# Patient Record
Sex: Male | Born: 1960 | Race: Black or African American | Hispanic: No | Marital: Single | Smoking: Current every day smoker
Health system: Southern US, Community
[De-identification: ages and names within clinical notes are randomized; demographics above are authoritative.]

## PROBLEM LIST (undated history)

## (undated) DIAGNOSIS — R51 Headache: Secondary | ICD-10-CM

## (undated) DIAGNOSIS — I10 Essential (primary) hypertension: Secondary | ICD-10-CM

## (undated) DIAGNOSIS — R9431 Abnormal electrocardiogram [ECG] [EKG]: Secondary | ICD-10-CM

## (undated) DIAGNOSIS — R079 Chest pain, unspecified: Secondary | ICD-10-CM

## (undated) DIAGNOSIS — Z9889 Other specified postprocedural states: Secondary | ICD-10-CM

## (undated) HISTORY — PX: IRRIGATION AND DEBRIDEMENT ABSCESS: SHX5252

---

## 2001-05-06 ENCOUNTER — Emergency Department (HOSPITAL_COMMUNITY): Admission: EM | Admit: 2001-05-06 | Discharge: 2001-05-06 | Payer: Self-pay | Admitting: Emergency Medicine

## 2001-05-11 ENCOUNTER — Ambulatory Visit (HOSPITAL_COMMUNITY): Admission: RE | Admit: 2001-05-11 | Discharge: 2001-05-11 | Payer: Self-pay | Admitting: General Surgery

## 2001-05-11 ENCOUNTER — Encounter: Payer: Self-pay | Admitting: General Surgery

## 2001-10-01 ENCOUNTER — Emergency Department (HOSPITAL_COMMUNITY): Admission: EM | Admit: 2001-10-01 | Discharge: 2001-10-01 | Payer: Self-pay | Admitting: Emergency Medicine

## 2001-10-28 ENCOUNTER — Emergency Department (HOSPITAL_COMMUNITY): Admission: EM | Admit: 2001-10-28 | Discharge: 2001-10-28 | Payer: Self-pay | Admitting: Emergency Medicine

## 2004-09-11 ENCOUNTER — Emergency Department (HOSPITAL_COMMUNITY): Admission: EM | Admit: 2004-09-11 | Discharge: 2004-09-11 | Payer: Self-pay | Admitting: Emergency Medicine

## 2004-10-30 ENCOUNTER — Emergency Department (HOSPITAL_COMMUNITY): Admission: EM | Admit: 2004-10-30 | Discharge: 2004-10-30 | Payer: Self-pay | Admitting: Emergency Medicine

## 2005-02-08 ENCOUNTER — Emergency Department (HOSPITAL_COMMUNITY): Admission: EM | Admit: 2005-02-08 | Discharge: 2005-02-08 | Payer: Self-pay | Admitting: Emergency Medicine

## 2005-06-09 ENCOUNTER — Emergency Department (HOSPITAL_COMMUNITY): Admission: EM | Admit: 2005-06-09 | Discharge: 2005-06-09 | Payer: Self-pay | Admitting: Emergency Medicine

## 2006-04-21 ENCOUNTER — Emergency Department (HOSPITAL_COMMUNITY): Admission: EM | Admit: 2006-04-21 | Discharge: 2006-04-21 | Payer: Self-pay | Admitting: Emergency Medicine

## 2006-09-10 ENCOUNTER — Emergency Department (HOSPITAL_COMMUNITY): Admission: EM | Admit: 2006-09-10 | Discharge: 2006-09-10 | Payer: Self-pay | Admitting: Emergency Medicine

## 2007-07-19 ENCOUNTER — Emergency Department (HOSPITAL_COMMUNITY): Admission: EM | Admit: 2007-07-19 | Discharge: 2007-07-19 | Payer: Self-pay | Admitting: Emergency Medicine

## 2007-09-14 ENCOUNTER — Emergency Department (HOSPITAL_COMMUNITY): Admission: EM | Admit: 2007-09-14 | Discharge: 2007-09-14 | Payer: Self-pay | Admitting: Emergency Medicine

## 2007-10-19 ENCOUNTER — Emergency Department (HOSPITAL_COMMUNITY): Admission: EM | Admit: 2007-10-19 | Discharge: 2007-10-19 | Payer: Self-pay | Admitting: Emergency Medicine

## 2008-06-09 ENCOUNTER — Emergency Department (HOSPITAL_COMMUNITY): Admission: EM | Admit: 2008-06-09 | Discharge: 2008-06-10 | Payer: Self-pay | Admitting: Emergency Medicine

## 2009-12-17 ENCOUNTER — Emergency Department (HOSPITAL_COMMUNITY): Admission: EM | Admit: 2009-12-17 | Discharge: 2009-12-17 | Payer: Self-pay | Admitting: Emergency Medicine

## 2010-10-18 ENCOUNTER — Encounter: Payer: Self-pay | Admitting: Internal Medicine

## 2011-07-02 LAB — RAPID STREP SCREEN (MED CTR MEBANE ONLY): Streptococcus, Group A Screen (Direct): NEGATIVE

## 2011-07-02 LAB — STREP A DNA PROBE: Group A Strep Probe: NEGATIVE

## 2011-07-02 LAB — URINALYSIS, ROUTINE W REFLEX MICROSCOPIC
Bilirubin Urine: NEGATIVE
Hgb urine dipstick: NEGATIVE
Nitrite: NEGATIVE
Protein, ur: NEGATIVE
pH: 6

## 2011-07-02 LAB — POCT CARDIAC MARKERS
Operator id: 211291
Troponin i, poc: <0.05

## 2011-07-02 LAB — POCT I-STAT CREATININE: Operator id: 211291

## 2011-07-02 LAB — RAPID URINE DRUG SCREEN, HOSP PERFORMED
Barbiturates: NOT DETECTED
Benzodiazepines: NOT DETECTED
Cocaine: POSITIVE — AB

## 2011-11-15 ENCOUNTER — Emergency Department (HOSPITAL_COMMUNITY)
Admission: EM | Admit: 2011-11-15 | Discharge: 2011-11-15 | Disposition: A | Discharge status: Home / Self Care | Payer: Self-pay | Attending: Emergency Medicine | Admitting: Emergency Medicine

## 2011-11-15 ENCOUNTER — Emergency Department (HOSPITAL_COMMUNITY): Payer: Self-pay

## 2011-11-15 ENCOUNTER — Encounter (HOSPITAL_COMMUNITY): Payer: Self-pay | Admitting: Emergency Medicine

## 2011-11-15 DIAGNOSIS — M25569 Pain in unspecified knee: Secondary | ICD-10-CM | POA: Insufficient documentation

## 2011-11-15 DIAGNOSIS — R269 Unspecified abnormalities of gait and mobility: Secondary | ICD-10-CM | POA: Insufficient documentation

## 2011-11-15 DIAGNOSIS — F17200 Nicotine dependence, unspecified, uncomplicated: Secondary | ICD-10-CM | POA: Insufficient documentation

## 2011-11-15 DIAGNOSIS — M25562 Pain in left knee: Secondary | ICD-10-CM

## 2011-11-15 MED ORDER — HYDROCODONE-ACETAMINOPHEN 5-325 MG PO TABS: ORAL_TABLET | ORAL | Status: AC

## 2011-11-15 MED ORDER — NAPROXEN 500 MG PO TABS: 500.0000 mg | ORAL_TABLET | Freq: Two times a day (BID) | ORAL | Status: DC

## 2011-11-15 NOTE — ED Provider Notes (Signed)
History     CSN: [id number]  Arrival date & time 11/15/11  4098   First MD Initiated Contact with Patient 11/15/11 [phone]      Chief Complaint  Patient presents with  . Knee Pain    (Consider location/radiation/quality/duration/timing/severity/associated sxs/prior treatment) HPI Comments: Patient complains of a gradual onset of left knee pain that began last evening. He denies known injury to the knee but states that he walked a lot yesterday.  He states the pain is worsened with movement, palpation, or weightbearing and improves with rest. Pain is localized to the medial aspect of the left knee.   He denies swelling, numbness, or weakness to the leg. He also denies any calf pain.  He states that he had similar symptoms back in 2002 and was given medication by his primary care physician at that time and the symptoms resolved.  Patient is a 51 y.o. male presenting with knee pain. The history is provided by the patient. No language interpreter was used.  Knee Pain This is a recurrent problem. The current episode started yesterday. The problem occurs constantly. The problem has been unchanged. Associated symptoms include arthralgias. Pertinent negatives include no chest pain, fever, headaches, joint swelling, nausea, neck pain, numbness, rash, urinary symptoms, vomiting or weakness. The symptoms are aggravated by walking, twisting and standing (Palpation). He has tried nothing for the symptoms. The treatment provided no relief.    History reviewed. No pertinent past medical history.  History reviewed. No pertinent past surgical history.  History reviewed. No pertinent family history.  History  Substance Use Topics  . Smoking status: Current Everyday Smoker    Types: Cigarettes  . Smokeless tobacco: Not on file  . Alcohol Use: Yes      Review of Systems  Constitutional: Negative for fever, activity change and appetite change.  HENT: Negative for neck pain.   Respiratory: Negative  for chest tightness.   Cardiovascular: Negative for chest pain.  Gastrointestinal: Negative for nausea and vomiting.  Musculoskeletal: Positive for arthralgias and gait problem. Negative for joint swelling.  Skin: Negative.  Negative for rash.  Neurological: Negative for dizziness, weakness, numbness and headaches.  All other systems reviewed and are negative.    Allergies  Review of patient's allergies indicates no known allergies.  Home Medications  No current outpatient prescriptions on file.  BP 119/78  Pulse 72  Temp(Src) 98.2 F (36.8 C) (Oral)  Resp 18  Ht 5\' 7"  (1.702 m)  Wt 175 lb (79.379 kg)  BMI 27.41 kg/m2  SpO2 99%  Physical Exam  Nursing note and vitals reviewed. Constitutional: He is oriented to person, place, and time. He appears well-developed and well-nourished. No distress.  HENT:  Head: Normocephalic and atraumatic.  Mouth/Throat: Oropharynx is clear and moist.  Neck: Normal range of motion. Neck supple.  Cardiovascular: Normal rate, regular rhythm, normal heart sounds and intact distal pulses.   Pulmonary/Chest: Effort normal and breath sounds normal. No respiratory distress.  Musculoskeletal: Normal range of motion. He exhibits tenderness. He exhibits no edema.       Left knee: He exhibits normal range of motion, no swelling, no effusion, no ecchymosis, no deformity, no laceration, no erythema, normal alignment and normal patellar mobility. tenderness found. Medial joint line tenderness noted. No lateral joint line and no patellar tendon tenderness noted.       Legs:      Localized tenderness to palpation along the medial meniscus.  Patient has full range of motion of the left knee,  no crepitus, effusion, erythema, ligament instabilty or obvious deformities of the knee.    Neurological: He is alert and oriented to person, place, and time. He exhibits normal muscle tone. Coordination normal.  Skin: Skin is warm and dry.    ED Course  Procedures  (including critical care time)   Dg Knee Complete 4 Views Left  11/15/2011  *RADIOLOGY REPORT*  Clinical Data: Left knee pain.  LEFT KNEE - COMPLETE 4+ VIEW  Comparison: None  Findings: The joint spaces are maintained.  No acute fracture or osteochondral abnormality.  No significant degenerative changes.  A small joint effusion is noted.  IMPRESSION:  1.  No acute bony findings or significant degenerative changes. 2.  Small joint effusion.  Original Report Authenticated By: P. Loralie Champagne, M.D.     Knee immobilizer was applied by the nursing staff.  Pain improved.  Remains NV intact.  Pt has his own crutches.    MDM     Patient's previous medical records were reviewed by me.  Previous imaging of the left knee in 2002 also reviewed by me.  Pt has ttp over the medial mensicus without edema, crepitus or decrease in ROM of the knee.  No posterior knee pain, calf pain or LE swelling.  Distal sensation intact, DP pulse is brisk.  Small effusion on x-ray.  We have applied a knee immobilizer and he agrees to close f/u with Dr. Romeo Apple.  Clinical suspicion for septic joint or DVT is low.   Patient / Family / Caregiver understand and agree with initial ED impression and plan with expectations set for ED visit. Pt feels improved after observation and/or treatment in ED.      Soriah Leeman L. [place], [place] 11/15/11 [phone]

## 2011-11-15 NOTE — ED Provider Notes (Signed)
Medical screening examination/treatment/procedure(s) were performed by non-physician practitioner and as supervising physician I was immediately available for consultation/collaboration.  Nicholes Stairs, MD 11/15/11 [phone]

## 2011-11-15 NOTE — ED Notes (Signed)
Patient with no complaints at this time. Respirations even and unlabored. Skin warm/dry. Discharge instructions reviewed with patient at this time. Patient given opportunity to voice concerns/ask questions. Patient discharged at this time and left Emergency Department with steady gait.   

## 2011-11-15 NOTE — Discharge Instructions (Signed)
Knee Pain °Knee pain can be a result of an injury or other medical conditions. Treatment will depend on the cause of your pain. °HOME CARE °· Only take medicine as told by your doctor.  °· Keep a healthy weight. Being overweight can make the knee hurt more.  °· Stretch before exercising or playing sports.  °· If there is constant knee pain, change the way you exercise. Ask your doctor for advice.  °· Make sure shoes fit well. Choose the right shoe for the sport or activity.  °· Protect your knees. Wear kneepads if needed.  °· Rest when you are tired.  °GET HELP RIGHT AWAY IF:  °· Your knee pain does not stop.  °· Your knee pain does not get better.  °· Your knee joint feels hot to the touch.  °· You have a temperature by mouth above 102° F (38.9° C), not controlled by medicine.  °· Your baby is older than 3 months with a rectal temperature of 102° F (38.9° C) or higher.  °· Your baby is 3 months old or younger with a rectal temperature of 100.4° F (38° C) or higher.  °MAKE SURE YOU:  °· Understand these instructions.  °· Will watch this condition.  °· Will get help right away if you are not doing well or get worse.  °Document Released: 12/10/2008 Document Revised: 05/26/2011 Document Reviewed: 12/10/2008 °[employer]® Patient Information ©2012 [employer]. °

## 2011-11-15 NOTE — ED Notes (Signed)
Pt c/o left knee pain that started last night. Pt denies any injury to the area.

## 2012-05-08 ENCOUNTER — Emergency Department (HOSPITAL_COMMUNITY): Payer: No Typology Code available for payment source

## 2012-05-08 ENCOUNTER — Encounter (HOSPITAL_COMMUNITY): Payer: Self-pay | Admitting: *Deleted

## 2012-05-08 ENCOUNTER — Emergency Department (HOSPITAL_COMMUNITY)
Admission: EM | Admit: 2012-05-08 | Discharge: 2012-05-08 | Disposition: A | Discharge status: Home / Self Care | Payer: No Typology Code available for payment source | Attending: Emergency Medicine | Admitting: Emergency Medicine

## 2012-05-08 DIAGNOSIS — M549 Dorsalgia, unspecified: Secondary | ICD-10-CM

## 2012-05-08 DIAGNOSIS — S139XXA Sprain of joints and ligaments of unspecified parts of neck, initial encounter: Secondary | ICD-10-CM | POA: Insufficient documentation

## 2012-05-08 DIAGNOSIS — Y92410 Unspecified street and highway as the place of occurrence of the external cause: Secondary | ICD-10-CM | POA: Insufficient documentation

## 2012-05-08 DIAGNOSIS — S161XXA Strain of muscle, fascia and tendon at neck level, initial encounter: Secondary | ICD-10-CM

## 2012-05-08 DIAGNOSIS — F17200 Nicotine dependence, unspecified, uncomplicated: Secondary | ICD-10-CM | POA: Insufficient documentation

## 2012-05-08 MED ORDER — OXYCODONE-ACETAMINOPHEN 5-325 MG PO TABS: 1.0000 | ORAL_TABLET | ORAL | Status: AC | PRN

## 2012-05-08 MED ORDER — NAPROXEN 500 MG PO TABS: 500.0000 mg | ORAL_TABLET | Freq: Two times a day (BID) | ORAL | Status: DC
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
## 2012-05-13 NOTE — ED Provider Notes (Signed)
Medical screening examination/treatment/procedure(s) were performed by non-physician practitioner and as supervising physician I was immediately available for consultation/collaboration.   Lilo Wallington L Daylah Sayavong, MD 05/13/12 1504 

## 2013-02-23 ENCOUNTER — Encounter (HOSPITAL_COMMUNITY): Payer: Self-pay

## 2013-02-23 ENCOUNTER — Emergency Department (INDEPENDENT_AMBULATORY_CARE_PROVIDER_SITE_OTHER)
Admission: EM | Admit: 2013-02-23 | Discharge: 2013-02-23 | Disposition: A | Discharge status: Home / Self Care | Payer: Self-pay | Source: Home / Self Care

## 2013-02-23 DIAGNOSIS — M545 Low back pain: Secondary | ICD-10-CM

## 2013-02-23 DIAGNOSIS — A630 Anogenital (venereal) warts: Secondary | ICD-10-CM

## 2013-02-23 LAB — POCT URINALYSIS DIP (DEVICE)
Bilirubin Urine: NEGATIVE
Hgb urine dipstick: NEGATIVE
Ketones, ur: NEGATIVE mg/dL
Specific Gravity, Urine: >=1.03 (ref 1.005–1.030)
pH: 5.5 (ref 5.0–8.0)

## 2013-02-23 MED ORDER — NAPROXEN 500 MG PO TABS: 500.0000 mg | ORAL_TABLET | Freq: Two times a day (BID) | ORAL | Status: DC

## 2013-02-23 MED ORDER — IMIQUIMOD 5 % EX CREA
TOPICAL_CREAM | CUTANEOUS | Status: DC
Start: 2013-02-23 — End: 2013-02-27

## 2013-02-23 NOTE — ED Provider Notes (Signed)
Medical screening examination/treatment/procedure(s) were performed by non-physician practitioner and as supervising physician I was immediately available for consultation/collaboration.  Tony Granquist, M.D.  Jaman Aro C Kelse Ploch, MD 02/23/13 [phone] 

## 2013-02-23 NOTE — ED Notes (Signed)
Presents w 2 problems. 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 C/o has pain in his low back for several days, and has a lesion of some kind on side of penis

## 2013-02-23 NOTE — ED Provider Notes (Signed)
History     CSN: [id number]  Arrival date & time 02/23/13  1328   None     Chief Complaint  Patient presents with  . Back Pain    (Consider location/radiation/quality/duration/timing/severity/associated sxs/prior treatment) HPI Comments: Pt presents c/o lesions on his penis and lower back pain.  He first noticed these a few days ago.  He picked one of the lesions off and it bled, but it has not been painful at all.  He does not think he has ever had these before.  His lower back pain has been going on for about a month and is worst after he has been immobile for some time, relieved by walking around and loosening it up.  Denies groin pain, pain in testicles or penis, fever, chills, abdominal pain, dysuria.    Patient is a 52 y.o. male presenting with back pain.  Back Pain Associated symptoms: no abdominal pain, no chest pain, no dysuria, no fever and no weakness     History reviewed. No pertinent past medical history.  History reviewed. No pertinent past surgical history.  History reviewed. No pertinent family history.  History  Substance Use Topics  . Smoking status: Current Every Day Smoker    Types: Cigarettes  . Smokeless tobacco: Not on file  . Alcohol Use: Yes      Review of Systems  Constitutional: Negative for fever, chills and fatigue.  HENT: Negative for sore throat, neck pain and neck stiffness.   Eyes: Negative for visual disturbance.  Respiratory: Negative for cough and shortness of breath.   Cardiovascular: Negative for chest pain, palpitations and leg swelling.  Gastrointestinal: Negative for nausea, vomiting, abdominal pain, diarrhea and constipation.  Genitourinary: Negative for dysuria, urgency, frequency and hematuria.       Penile lesions   Musculoskeletal: Positive for back pain. Negative for myalgias and arthralgias.  Skin: Negative for rash.  Neurological: Negative for dizziness, weakness and light-headedness.    Allergies  Review of  patient's allergies indicates no known allergies.  Home Medications   Current Outpatient Rx  Name  Route  Sig  Dispense  Refill  . imiquimod (ALDARA) 5 % cream   Topical   Apply topically 3 (three) times a week. Apply MWF for up to 16 weeks as needed   12 each   0   . naproxen (NAPROSYN) 500 MG tablet   Oral   Take 1 tablet (500 mg total) by mouth 2 (two) times daily.   30 tablet   0   . naproxen (NAPROSYN) 500 MG tablet   Oral   Take 1 tablet (500 mg total) by mouth 2 (two) times daily.   60 tablet   0     BP 125/77  Pulse 70  Temp(Src) 98.5 F (36.9 C) (Oral)  Resp 20  SpO2 98%  Physical Exam  Constitutional: He is oriented to person, place, and time. He appears well-developed and well-nourished. No distress.  HENT:  Head: Normocephalic and atraumatic.  Eyes: EOM are normal. Pupils are equal, round, and reactive to light.  Cardiovascular: Normal rate and regular rhythm.  Exam reveals no gallop and no friction rub.   No murmur heard. Pulmonary/Chest: Effort normal and breath sounds normal. No respiratory distress. He has no wheezes. He has no rales.  Abdominal: Soft. There is no tenderness.  Genitourinary:    Circumcised. No penile erythema or penile tenderness. No discharge found.  Musculoskeletal:       Lumbar back: He exhibits tenderness (mild  midline and in paraspinous musculature ). He exhibits normal range of motion.  Neurological: He is oriented to person, place, and time.  Skin: Skin is warm and dry. No rash noted.  Psychiatric: He has a normal mood and affect. Judgment normal.    ED Course  Procedures (including critical care time)  Labs Reviewed  POCT URINALYSIS DIP (DEVICE)   No results found.   1. Condyloma acuminatum of penis   2. Lumbago       MDM  Will treat this patients low back pain with naproxen BID and encouraged to stay active, also given a list of back strengthening exercises to do.    For the condyloma, Aldara three times  weekly for up to 16 weeks.  He will f/u with PCP or derm if this does not resolve the issue.    Meds ordered this encounter  Medications  . imiquimod (ALDARA) 5 % cream    Sig: Apply topically 3 (three) times a week. Apply MWF for up to 16 weeks as needed    Dispense:  12 each    Refill:  0  . naproxen (NAPROSYN) 500 MG tablet    Sig: Take 1 tablet (500 mg total) by mouth 2 (two) times daily.    Dispense:  60 tablet    Refill:  0           Graylon Good, PA-C 02/23/13 [phone]

## 2013-02-23 NOTE — ED Notes (Signed)
Waiting discharge papers 

## 2013-02-26 DIAGNOSIS — R079 Chest pain, unspecified: Secondary | ICD-10-CM

## 2013-02-26 HISTORY — DX: Chest pain, unspecified: R07.9

## 2013-02-27 ENCOUNTER — Inpatient Hospital Stay (HOSPITAL_COMMUNITY)
Admission: EM | Admit: 2013-02-27 | Discharge: 2013-03-01 | DRG: 287 | Disposition: A | Discharge status: Home / Self Care | Payer: 59 | Attending: Internal Medicine | Admitting: Internal Medicine

## 2013-02-27 ENCOUNTER — Encounter (HOSPITAL_COMMUNITY): Payer: Self-pay | Admitting: Emergency Medicine

## 2013-02-27 ENCOUNTER — Emergency Department (HOSPITAL_COMMUNITY): Payer: Self-pay

## 2013-02-27 DIAGNOSIS — Z7982 Long term (current) use of aspirin: Secondary | ICD-10-CM

## 2013-02-27 DIAGNOSIS — R0789 Other chest pain: Secondary | ICD-10-CM | POA: Diagnosis present

## 2013-02-27 DIAGNOSIS — R079 Chest pain, unspecified: Secondary | ICD-10-CM

## 2013-02-27 DIAGNOSIS — R072 Precordial pain: Secondary | ICD-10-CM

## 2013-02-27 DIAGNOSIS — R9431 Abnormal electrocardiogram [ECG] [EKG]: Secondary | ICD-10-CM | POA: Diagnosis present

## 2013-02-27 DIAGNOSIS — F1410 Cocaine abuse, uncomplicated: Secondary | ICD-10-CM | POA: Diagnosis present

## 2013-02-27 DIAGNOSIS — R059 Cough, unspecified: Secondary | ICD-10-CM | POA: Diagnosis present

## 2013-02-27 DIAGNOSIS — I10 Essential (primary) hypertension: Principal | ICD-10-CM | POA: Diagnosis present

## 2013-02-27 DIAGNOSIS — F1210 Cannabis abuse, uncomplicated: Secondary | ICD-10-CM | POA: Diagnosis present

## 2013-02-27 DIAGNOSIS — Z720 Tobacco use: Secondary | ICD-10-CM | POA: Diagnosis present

## 2013-02-27 DIAGNOSIS — R51 Headache: Secondary | ICD-10-CM | POA: Diagnosis present

## 2013-02-27 DIAGNOSIS — J020 Streptococcal pharyngitis: Secondary | ICD-10-CM | POA: Diagnosis present

## 2013-02-27 DIAGNOSIS — Z9889 Other specified postprocedural states: Secondary | ICD-10-CM

## 2013-02-27 DIAGNOSIS — F17200 Nicotine dependence, unspecified, uncomplicated: Secondary | ICD-10-CM | POA: Diagnosis present

## 2013-02-27 DIAGNOSIS — R05 Cough: Secondary | ICD-10-CM | POA: Diagnosis present

## 2013-02-27 DIAGNOSIS — I200 Unstable angina: Secondary | ICD-10-CM

## 2013-02-27 DIAGNOSIS — F1411 Cocaine abuse, in remission: Secondary | ICD-10-CM | POA: Diagnosis not present

## 2013-02-27 HISTORY — DX: Headache: R51

## 2013-02-27 HISTORY — DX: Chest pain, unspecified: R07.9

## 2013-02-27 HISTORY — DX: Abnormal electrocardiogram (ECG) (EKG): R94.31

## 2013-02-27 HISTORY — DX: Other specified postprocedural states: Z98.890

## 2013-02-27 LAB — URINALYSIS, ROUTINE W REFLEX MICROSCOPIC
Leukocytes, UA: NEGATIVE
Nitrite: NEGATIVE
Specific Gravity, Urine: 1.02 (ref 1.005–1.030)
Urobilinogen, UA: 0.2 mg/dL (ref 0.0–1.0)
pH: 5.5 (ref 5.0–8.0)

## 2013-02-27 LAB — DIFFERENTIAL
Basophils Relative: 0 % (ref 0–1)
Lymphocytes Relative: 16 % (ref 12–46)
Lymphs Abs: 2 10*3/uL (ref 0.7–4.0)
Monocytes Absolute: 2.2 10*3/uL — ABNORMAL HIGH (ref 0.1–1.0)
Monocytes Relative: 18 % — ABNORMAL HIGH (ref 3–12)
Neutro Abs: 8.2 10*3/uL — ABNORMAL HIGH (ref 1.7–7.7)

## 2013-02-27 LAB — BASIC METABOLIC PANEL
BUN: 9 mg/dL (ref 6–23)
CO2: 25 mEq/L (ref 19–32)
Calcium: 9.4 mg/dL (ref 8.4–10.5)
GFR calc non Af Amer: >90 mL/min (ref >90)
Glucose, Bld: 109 mg/dL — ABNORMAL HIGH (ref 70–99)

## 2013-02-27 LAB — HEMOGLOBIN A1C: Mean Plasma Glucose: 111 mg/dL (ref <117)

## 2013-02-27 LAB — RAPID URINE DRUG SCREEN, HOSP PERFORMED
Barbiturates: NOT DETECTED
Benzodiazepines: NOT DETECTED
Cocaine: NOT DETECTED

## 2013-02-27 LAB — HEPATIC FUNCTION PANEL
ALT: 17 U/L (ref 0–53)
AST: 17 U/L (ref 0–37)
Alkaline Phosphatase: 89 U/L (ref 39–117)
Bilirubin, Direct: <0.1 mg/dL (ref 0.0–0.3)

## 2013-02-27 LAB — TROPONIN I
Troponin I: <0.3 ng/mL (ref <0.30)
Troponin I: <0.3 ng/mL (ref <0.30)

## 2013-02-27 LAB — CBC
HCT: 42.5 % (ref 39.0–52.0)
Hemoglobin: 15.3 g/dL (ref 13.0–17.0)
MCH: 29.3 pg (ref 26.0–34.0)
MCHC: 36 g/dL (ref 30.0–36.0)
MCV: 81.3 fL (ref 78.0–100.0)
RBC: 5.23 MIL/uL (ref 4.22–5.81)

## 2013-02-27 LAB — LIPID PANEL
Cholesterol: 189 mg/dL (ref 0–200)
Total CHOL/HDL Ratio: 3.3 RATIO
Triglycerides: 39 mg/dL (ref <150)
VLDL: 8 mg/dL (ref 0–40)

## 2013-02-27 MED ORDER — HEPARIN BOLUS VIA INFUSION
4000.0000 [IU] | Freq: Once | INTRAVENOUS | Status: AC
  Administered 2013-02-27: 4000 [IU] via INTRAVENOUS
  Filled 2013-02-27: qty 4000

## 2013-02-27 MED ORDER — ACETAMINOPHEN 325 MG PO TABS
650.0000 mg | ORAL_TABLET | Freq: Once | ORAL | Status: AC
  Administered 2013-02-27: 650 mg via ORAL
  Filled 2013-02-27: qty 2

## 2013-02-27 MED ORDER — ASPIRIN 325 MG PO TABS
325.0000 mg | ORAL_TABLET | ORAL | Status: AC
  Administered 2013-02-27: 325 mg via ORAL
  Filled 2013-02-27: qty 4

## 2013-02-27 MED ORDER — ACETAMINOPHEN 325 MG PO TABS
650.0000 mg | ORAL_TABLET | Freq: Four times a day (QID) | ORAL | Status: DC | PRN
  Administered 2013-02-27 – 2013-02-28 (×3): 650 mg via ORAL
  Filled 2013-02-27 (×3): qty 2

## 2013-02-27 MED ORDER — MORPHINE SULFATE 2 MG/ML IJ SOLN
2.0000 mg | Freq: Once | INTRAMUSCULAR | Status: AC
  Administered 2013-02-27: 2 mg via INTRAVENOUS
  Filled 2013-02-27: qty 1

## 2013-02-27 MED ORDER — SODIUM CHLORIDE 0.9 % IJ SOLN
3.0000 mL | Freq: Two times a day (BID) | INTRAMUSCULAR | Status: DC
  Administered 2013-02-27 – 2013-03-01 (×5): 3 mL via INTRAVENOUS

## 2013-02-27 MED ORDER — DILTIAZEM HCL 30 MG PO TABS
30.0000 mg | ORAL_TABLET | Freq: Four times a day (QID) | ORAL | Status: DC
  Administered 2013-02-27 – 2013-02-28 (×4): 30 mg via ORAL
  Filled 2013-02-27 (×8): qty 1

## 2013-02-27 MED ORDER — MORPHINE SULFATE 2 MG/ML IJ SOLN
2.0000 mg | INTRAMUSCULAR | Status: DC | PRN
  Administered 2013-02-27: 2 mg via INTRAVENOUS
  Filled 2013-02-27: qty 1

## 2013-02-27 MED ORDER — ONDANSETRON HCL 4 MG PO TABS: 4.0000 mg | ORAL_TABLET | Freq: Four times a day (QID) | ORAL | Status: DC | PRN

## 2013-02-27 MED ORDER — MENTHOL 3 MG MT LOZG
1.0000 | LOZENGE | OROMUCOSAL | Status: DC | PRN
  Filled 2013-02-27: qty 9

## 2013-02-27 MED ORDER — ACETAMINOPHEN 650 MG RE SUPP: 650.0000 mg | Freq: Four times a day (QID) | RECTAL | Status: DC | PRN

## 2013-02-27 MED ORDER — ENOXAPARIN SODIUM 40 MG/0.4ML ~~LOC~~ SOLN: 40.0000 mg | SUBCUTANEOUS | Status: DC

## 2013-02-27 MED ORDER — ATORVASTATIN CALCIUM 80 MG PO TABS
80.0000 mg | ORAL_TABLET | Freq: Every day | ORAL | Status: DC
  Administered 2013-02-27 – 2013-03-01 (×3): 80 mg via ORAL
  Filled 2013-02-27 (×4): qty 1

## 2013-02-27 MED ORDER — THIAMINE HCL 100 MG/ML IJ SOLN
Freq: Once | INTRAVENOUS | Status: AC
  Administered 2013-02-27: 12:00:00 via INTRAVENOUS
  Filled 2013-02-27: qty 1000

## 2013-02-27 MED ORDER — ONDANSETRON HCL 4 MG/2ML IJ SOLN: 4.0000 mg | Freq: Four times a day (QID) | INTRAMUSCULAR | Status: DC | PRN

## 2013-02-27 MED ORDER — NITROGLYCERIN 0.4 MG SL SUBL: 0.4000 mg | SUBLINGUAL_TABLET | SUBLINGUAL | Status: DC | PRN

## 2013-02-27 MED ORDER — HEPARIN BOLUS VIA INFUSION
3000.0000 [IU] | Freq: Once | INTRAVENOUS | Status: AC
  Administered 2013-02-27: 3000 [IU] via INTRAVENOUS
  Filled 2013-02-27: qty 3000

## 2013-02-27 MED ORDER — ASPIRIN EC 81 MG PO TBEC
81.0000 mg | DELAYED_RELEASE_TABLET | Freq: Every day | ORAL | Status: DC
  Filled 2013-02-27 (×2): qty 1

## 2013-02-27 MED ORDER — NITROGLYCERIN 0.4 MG SL SUBL
0.4000 mg | SUBLINGUAL_TABLET | SUBLINGUAL | Status: DC | PRN
  Administered 2013-02-27 (×3): 0.4 mg via SUBLINGUAL
  Filled 2013-02-27: qty 25

## 2013-02-27 MED ORDER — HEPARIN (PORCINE) IN NACL 100-0.45 UNIT/ML-% IJ SOLN
1550.0000 [IU]/h | INTRAMUSCULAR | Status: DC
  Administered 2013-02-27: 1400 [IU]/h via INTRAVENOUS
  Administered 2013-02-27: 1000 [IU]/h via INTRAVENOUS
  Administered 2013-02-28: 1400 [IU]/h via INTRAVENOUS
  Administered 2013-02-28: 1550 [IU]/h via INTRAVENOUS
  Filled 2013-02-27 (×4): qty 250

## 2013-02-27 NOTE — ED Provider Notes (Signed)
Medical screening examination/treatment/procedure(s) were performed by non-physician practitioner and as supervising physician I was immediately available for consultation/collaboration.   Charles B. Bernette Mayers, MD 02/27/13 0830

## 2013-02-27 NOTE — Consult Note (Signed)
Pt. Seen and examined. Agree with the NP/PA-C note as written.  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 He doesn't report significant cardiac disease in the family, but there is diabetes. He has had a couple of episodes last night and this morning of squeezing substernal chest pain, headache, nausea and lack of appetite. He also felt throat tightness, which virtually disappeared along with his chest pain after the administration of nitroglycerin. Initial troponin is negative, however, it may be too early to see a rise. EKG shows LVH and T-wave changes which could represent ischemia or repolarization abnormality. Unfortunately, with cocaine use and tobacco use, he is at risk for coronary plaque or coronary spasm.  Cardiac catheterization in this setting is the most reasonable way to evaluate his coronaries.  I discussed the cardiac catheterization procedure with him at length, including the risks/benefits and he is agreeable to it.  Plan to keep him NPO p MN for [facility] tomorrow.  Continue heparin - banana bag. Nitroglycerin prn for chest pain - he is chest pain free now.  We can continue to follow with you or would be happy to assume care on our service - please let us know.  Chrystie Nose, MD, [facility] Attending Cardiologist [facility]

## 2013-02-27 NOTE — H&P (Signed)
Date: 02/27/2013               Patient Name:  Ivan Murphy MRN: [id number]  DOB: 09-Jul-1961 Age / Sex: 52 y.o., male   PCP: None              Medical Service: Internal Medicine Teaching Service              Attending Physician: Dr. Lars Mage, MD    First Contact: Dr. Christen Bame Pager: [phone]  Second Contact: Dr. Lorretta Harp Pager: [phone]            After Hours (After 5p/  First Contact Pager: [phone]  weekends / holidays): Second Contact Pager: [phone]   Chief Complaint: Chest pain, cough, sore throat  History of Present Illness: Ivan Murphy is a 52 year old man with no known PMH who presents to the [facility] ED this morning after being awakened from sleep about midnight with left sided chest pain.  He states that he has never had pain like this before and that it is a squeezing pain that starts in his left chest and moves to his left axilla.  He rates the pain 7/10 and it has not responded to tylenol or 3 nitroglycerin.  He denies radiation to the neck, down the arm, or to the back.  He also denies fever, chills, nausea, vomiting, rhinorrhea, reflux, or abdominal pain.  He states that yesterday he started to get a sore throat and a non-productive cough.  He also developed a headache yesterday afternoon which did not respond to a [employer].  He states that he has no family history of heart disease, high blood pressure, or cholesterol problems.  He has 1 brother who had diabetes who passed away in Feb 13, 1979 but he doesn't know what he died from.  Father is deceased but he does not know the cause.    Meds: No current outpatient prescriptions on file.   Allergies: Allergies as of 02/27/2013  . (No Known Allergies)   History reviewed. No pertinent past medical history. History reviewed. No pertinent past surgical history. Family History  Problem Relation Age of Onset  . Diabetes Brother    History   Social History  . Marital Status: Divorced    Spouse Name: N/A    Number of  Children: N/A  . Years of Education: N/A   Occupational History  . Not on file.   Social History Main Topics  . Smoking status: Former Smoker -- 1.00 packs/day for 39 years    Types: Cigarettes    Quit date: 02/12/2013  . Smokeless tobacco: Never Used  . Alcohol Use: No     Comment: history of cocaine and marijuana.  clean since 02/12/13.    . Drug Use: No  . Sexually Active: Not Currently   Other Topics Concern  . Not on file   Social History Narrative   Divorced.  One daughter 32 years old.  2 grandchildren.  Father deceased and mother living in group home in [place].  Currently living at the [facility] house.  Former cocaine, marijuana, and tobacco user clean since 02/12/13 when he moved into the [facility].  Works with the [employer].   Review of Systems: Constitutional: Denies fever, chills, diaphoresis, appetite change and fatigue.  HEENT: Denies photophobia, eye pain, redness, hearing loss, ear pain, congestion, sore throat, rhinorrhea, sneezing, mouth sores, trouble swallowing, neck pain, neck stiffness and tinnitus.   Respiratory: Positive for SOB, cough.  Denies  DOE, chest tightness,  and wheezing.   Cardiovascular: Positive for  chest pain.  Denies palpitations and leg swelling.  Gastrointestinal: Denies nausea, vomiting, abdominal pain, diarrhea, constipation, blood in stool and abdominal distention.  Genitourinary: Denies dysuria, urgency, frequency, hematuria, flank pain and difficulty urinating.  Endocrine: Denies: hot or cold intolerance, sweats, changes in hair or nails, polyuria, polydipsia. Musculoskeletal: Denies myalgias, back pain, joint swelling, arthralgias and gait problem.  Skin: Denies pallor, rash and wound.  Neurological: Denies dizziness, seizures, syncope, weakness, light-headedness, numbness and headaches.  Hematological: Denies adenopathy. Easy bruising, personal or family bleeding history  Psychiatric/Behavioral: Denies suicidal ideation, mood  changes, confusion, nervousness, sleep disturbance and agitation  Physical Exam: Blood pressure 123/81, pulse 89, temperature 100.7 F (38.2 C), temperature source Oral, resp. rate 15, SpO2 98.00%. Constitutional: Vital signs reviewed.  Patient is a well-developed and well-nourished man in no acute distress and cooperative with exam. Alert and oriented x3.  Head: Normocephalic and atraumatic Ear: TM normal bilaterally Nose: No erythema or drainage noted.  Turbinates normal Mouth: no erythema or exudates, MMM Eyes: PERRL, EOMI, conjunctivae normal, No scleral icterus.  Neck: Supple, Trachea midline normal ROM, No JVD, mass, thyromegaly, or carotid bruit present.  Cardiovascular: RRR, S1 normal, S2 normal, no MRG, pulses symmetric and intact bilaterally Pulmonary/Chest: normal respiratory effort, mild bibasilar inspiratory crackles that clear with cough.  no wheezes, or rhonchi Abdominal: Soft. Non-tender, non-distended, bowel sounds are normal, no masses, organomegaly, or guarding present.  GU: no CVA tenderness Musculoskeletal: No joint deformities, erythema, or stiffness, ROM full and no nontender Hematology: no cervical, inginal, or axillary adenopathy.  Neurological: A&O x3, Strength is normal and symmetric bilaterally, cranial nerve II-XII are grossly intact, no focal motor deficit, sensory intact to light touch bilaterally.  Skin: Warm, dry and intact. No rash, cyanosis, or clubbing.  Psychiatric: Normal mood and affect. speech and behavior is normal. Judgment and thought content normal. Cognition and memory are normal.   Lab results: Basic Metabolic Panel:  Recent Labs  16/10/96 0641  NA 136  K 3.8  CL 100  CO2 25  GLUCOSE 109*  BUN 9  CREATININE 0.98  CALCIUM 9.4   CBC:  Recent Labs  02/27/13 0641  WBC 11.0*  HGB 15.3  HCT 42.5  MCV 81.3  PLT 186   Cardiac Enzymes: POC Troponin I: 0.00  BNP:  Recent Labs  02/27/13 0640  PROBNP 61.2   Drugs of Abuse      Component Value Date/Time   LABOPIA NONE DETECTED 09/14/2007 0923   COCAINSCRNUR POSITIVE* 09/14/2007 0923   LABBENZ NONE DETECTED 09/14/2007 0923   AMPHETMU NONE DETECTED 09/14/2007 0923   THCU NONE DETECTED 09/14/2007 0923   LABBARB  Value: NONE DETECTED        DRUG SCREEN FOR MEDICAL PURPOSES ONLY.  IF CONFIRMATION IS NEEDED FOR ANY PURPOSE, NOTIFY LAB WITHIN 5 DAYS. 09/14/2007 0454    Imaging results:  Dg Chest 2 View  02/27/2013   *RADIOLOGY REPORT*  Clinical Data: Chest pain.  CHEST - 2 VIEW  Comparison: 09/15/2007  Findings: The heart size and pulmonary vascularity are normal and the lungs are clear.  No acute osseous abnormality.  Slight thoracic scoliosis.  IMPRESSION: No acute disease.  Of the   Original Report Authenticated By: Francene Boyers, M.D.   Other results: EKG: normal sinus rhythm, T wave inversions in II, III, aVF, V4, V5, and V6.  Borderline LVH, Likely left atrial enlargement.   Assessment & Plan by Problem:  Mr. Wrightson is a 52 year old man who presents to the [facility] ED with complaints of chest pain that started the morning of admission that awakened him from sleep.    1.  Chest pain: He is a former smoker as well as former cocaine user who has been clean for 4 weeks.  He has no history of hypertension, diabetes, or hypercholesterolemia.  He also has no family history of heart disease per his report. Differential diagnosis include ACS vs PE vs pneumonia vs muscle strain from coughing.  Initial Troponin is negative but his EKG is positive for T wave inversions in II, III, aVF, V4, V5, and V6 as well as likely Left atrial enlargement and LVH.  He is a former heavy smoker as well as a previous cocaine user though he states that he has been clean for 4 weeks from both.  His Modified Geneva score is 3, only for his pulse in the 80s, which is low probability.  Chest x-ray is clear of infiltrates but he does have a mild fever, cough, and a mildly elevated WBC count.  He has received 3  nitroglycerin in the ED which did not relieve his pain.  The pain did respond to 2 mg of morphine.    - Admit to observation telemetry   - Cycle troponin and repeat EKG in the morning  - Heparin drip per pharmacy until enzymes negative.   - ASA 81 mg  - risk stratification with FLP, A1C, and TSH  - Given his T wave changes and atypical presentation he will likely need a stress test done so we will consult cardiology.   - UDS  - 1L Banana bag  2.  Cough: He has a mild non-productive cough and sore throat though no signs of acute infection in the posterior pharynx.  Chest x-ray is clear and he does not appear ill.  We will continue to monitor and treat symptomatically.  3.  VTE: Lovenox  Dispo: Disposition is deferred at this time, awaiting improvement of current medical problems. Anticipated discharge in approximately 1-2 day(s).   The patient does not have a current PCP (No Pcp Per Patient), therefore will be requiring OPC follow-up after discharge.   The patient does not have transportation limitations that hinder transportation to clinic appointments.  Signed: Leodis Sias, MD 02/27/2013, 9:14 AM

## 2013-02-27 NOTE — ED Notes (Signed)
PT. REPORTS INTERMITTENT MID CHEST PAIN WITH SOB AND OCCASIONAL PRODUCTIVE COUGH ONSET THIS MORNING , DENIES NAUSEA OR DIAPHORESIS , PT. ALSO REPORTS SORE THROAT.

## 2013-02-27 NOTE — Progress Notes (Signed)
ANTICOAGULATION CONSULT NOTE - Initial Consult  Pharmacy Consult for IV heparin Indication: r/o ACS - STEMI  No Known Allergies  Patient Measurements: Height: 5\' 7"  (170.2 cm) Weight: 184 lb 3.2 oz (83.553 kg) IBW/kg (Calculated) : 66.1 Heparin Dosing Weight: 83 kg   Vital Signs: Temp: 100.7 F (38.2 C) (06/03 1029) Temp src: Oral (06/03 0723) BP: 118/67 mmHg (06/03 1029) Pulse Rate: 84 (06/03 1029)   Recent Labs  02/27/13 0641  HGB 15.3  HCT 42.5  PLT 186  CREATININE 0.98    Estimated Creatinine Clearance: 92.2 ml/min (by C-G formula based on Cr of 0.98).   Medical History: History reviewed. No pertinent past medical history.   Assessment: 52yo male presenting with chest pain, diaphoresis, nausea, and T-wave inversions being started on IV heparin for rule out ACS/STEMI. Hgb 15.3, Plts 186, Scr 0.98   Goal of Therapy:  Heparin level 0.3-0.7 units/ml Monitor platelets by anticoagulation protocol: Yes    Plan:  1) Heparin IV 4000 unit bolus x 1 2) Followed by IV heparin 1000 units/hr 3) Check 6 hour heparin level, then daily HL & CBC while on heparin 4) Monitor signs/symptoms of bleeding  5) F/u cardiac markers/plans   Benjaman Pott, PharmD, BCPS 02/27/2013   10:56 AM

## 2013-02-27 NOTE — Consult Note (Signed)
Reason for Consult: Chest pain Referring Physician:   [name] is an 52 y.o. male.  HPI:    The patient is a 52 year old African American male with history tobacco abuse since age 22 and  cocaine use. He apparently has not had any in the last month. He has no prior cardiac history and no family history of heart disease that he knows of.  Patient reports development of chest pain last night between 12 midnight and a 0100 hours after he got up to go the bathroom.  He indicated the pain did not wake him from sleep. He states that pain was 7-8/10 in intensity and felt like"Something squeezing me".  He also reports nausea and diaphoresis but no radiation of pain to arm, neck, back or jaw. He states says his shirt was so wet he had to change it.  He also reports that the pain was exacerbated with movement and inspiration, but that it also was improved with the second sublingual nitroglycerin which was given in the ER.  He is currently pain-free.  History reviewed. No pertinent past medical history.  History reviewed. No pertinent past surgical history.  Family History  Problem Relation Age of Onset  . Diabetes Brother     Social History:  reports that he quit smoking about 2 weeks ago. His smoking use included Cigarettes. He has a 39 pack-year smoking history. He has never used smokeless tobacco. He reports that he does not drink alcohol or use illicit drugs.  Allergies: No Known Allergies  Medications: None  Results for orders placed during the hospital encounter of 02/27/13 (from the past 48 hour(s))  PRO B NATRIURETIC PEPTIDE     Status: None   Collection Time    02/27/13  6:40 AM      Result Value Range   Pro B Natriuretic peptide (BNP) 61.2  0 - 125 pg/mL  CBC     Status: Abnormal   Collection Time    02/27/13  6:41 AM      Result Value Range   WBC 11.0 (*) 4.0 - 10.5 K/uL   RBC 5.23  4.22 - 5.81 MIL/uL   Hemoglobin 15.3  13.0 - 17.0 g/dL   HCT 96.0  45.4 - 09.8 %   MCV 81.3   78.0 - 100.0 fL   MCH 29.3  26.0 - 34.0 pg   MCHC 36.0  30.0 - 36.0 g/dL   RDW 11.9  14.7 - 82.9 %   Platelets 186  150 - 400 K/uL  BASIC METABOLIC PANEL     Status: Abnormal   Collection Time    02/27/13  6:41 AM      Result Value Range   Sodium 136  135 - 145 mEq/L   Potassium 3.8  3.5 - 5.1 mEq/L   Chloride 100  96 - 112 mEq/L   CO2 25  19 - 32 mEq/L   Glucose, Bld 109 (*) 70 - 99 mg/dL   BUN 9  6 - 23 mg/dL   Creatinine, Ser 5.62  0.50 - 1.35 mg/dL   Calcium 9.4  8.4 - 13.0 mg/dL   GFR calc non Af Amer >90  >90 mL/min   GFR calc Af Amer >90  >90 mL/min   Comment:            The eGFR has been calculated     using the CKD EPI equation.     This calculation has not been     validated in  all clinical     situations.     eGFR's persistently     <90 mL/min signify     possible Chronic Kidney Disease.  POCT I-STAT TROPONIN I     Status: None   Collection Time    02/27/13  7:00 AM      Result Value Range   Troponin i, poc 0.00  0.00 - 0.08 ng/mL   Comment 3            Comment: Due to the release kinetics of cTnI,     a negative result within the first hours     of the onset of symptoms does not rule out     myocardial infarction with certainty.     If myocardial infarction is still suspected,     repeat the test at appropriate intervals.    Dg Chest 2 View  02/27/2013   *RADIOLOGY REPORT*  Clinical Data: Chest pain.  CHEST - 2 VIEW  Comparison: 09/15/2007  Findings: The heart size and pulmonary vascularity are normal and the lungs are clear.  No acute osseous abnormality.  Slight thoracic scoliosis.  IMPRESSION: No acute disease.  Of the   Original Report Authenticated By: Francene Boyers, M.D.    Review of Systems  Constitutional: Positive for diaphoresis. Negative for fever.  HENT: Positive for sore throat.   Respiratory: Negative for cough and shortness of breath.   Cardiovascular: Positive for chest pain. Negative for orthopnea, leg swelling and PND.  Gastrointestinal:  Positive for nausea. Negative for vomiting, abdominal pain, blood in stool and melena.  Genitourinary: Negative for hematuria.  Musculoskeletal: Positive for back pain (started about2 weeks ago).  Neurological: Negative for dizziness.  All other systems reviewed and are negative.   Blood pressure 114/78, pulse 81, temperature 100.7 F (38.2 C), temperature source Oral, resp. rate 15, SpO2 98.00%. Physical Exam Well nourished, well developed, in no acute distress HEENT: Pupils are equal round react to light accommodation extraocular movements are intact. Oral mucosa is pink and moist. Neck: no JVDNo cervical lymphadenopathy. Cardiac: Regular rate and rhythm without murmurs rubs or gallops. Lungs:  clear to auscultation bilaterally, no wheezing, rhonchi or rales Abd: soft, nontender, positive bowel sounds all quadrants, no hepatosplenomegaly Ext: no lower extremity edema.  2+ radial and dorsalis pedis pulses. Skin: warm and dry Neuro:  Grossly normal, strength 5 out of 5 equal upper and lower extremities  EKG: Shows sharp T-wave inversions inferiorly and in V 4-6, LVH  Assessment/Plan: Principal Problem:   Chest pain Active Problems:   Headache   Cough   Plan: 52 year old male with history of cocaine use, tobacco abuse since age 18. He presented with left-sided "squeezing" chest pain, diaphoresis, nausea and with T wave inversions inferior and laterally along with LVH indications(no prior EKG). He also reported to me that he had decreased chest pain after the second sublingual nitroglycerin.  Initial troponin negative. The patient will probably be best evaluated with coronary angiogram.  Avoid use of beta blockers given cocaine history.  Continue to cycle cardiac enzymes. Check lipids, TSH, A1c.  Will order 2D echo.  Blood pressure heart rate currently controlled.  UDS is negative.  No signs or symptoms of CHF.   Ivan Murphy 02/27/2013, 10:12 AM

## 2013-02-27 NOTE — ED Notes (Signed)
Admitting, MD notified re: pts pain level & current bed assignment, will continue to monitor pt

## 2013-02-27 NOTE — Progress Notes (Signed)
  Echocardiogram 2D Echocardiogram has been performed.  Georgian Co 02/27/2013, 5:42 PM

## 2013-02-27 NOTE — H&P (Signed)
Internal Medicine Attending Admission Note Date: 02/27/2013  Patient name: Ivan Murphy Medical record number: [id number] Date of birth: 1960/12/29 Age: 52 y.o. Gender: male  I saw and evaluated the patient. I reviewed the resident's note and I agree with the resident's findings and plan as documented in the resident's note.  Chief Complaint(s):  Chest pain  History - key components related to admission: patient is a 52 year old man with limited medical care in the past and medical history significant for smoking and substance abuse including cocaine comes in with chief complaint of chest pain which started midnight when he woke up to use the bathroom.  Pain 8/10, left side of chest, squeezing/pressure like in character, associated with nausea and diaphoresis. Pain did not radiate. Exacerbated by movement and inspiration. Patient could not sleep all night due to pain and decided to come to ER in the morning. Patient's pain was relieved after morphine and nitroglycerin in ER. He is currently chest pain free. Patient was having headaches all day yesterday ie one day prior to admission for which he took 2 goody powders during the day and another 2 prior to getting to sleep.   Patient works as a Engineer, water and his physical activity has been limited by chest pain since last few week. He can only walk about 1 block prior to getting short of breath.   He has not smoked or used any drugs in last 3 weeks.   Patient denies any shortness of breath, belly pain, change in urinary habits, change in bowel movements, sleeping problems, weakness or numbness anywhere in the body.   15 point review of system is otherwise negative except what is noted above.    Physical Exam - key components related to admission:  Filed Vitals:   02/27/13 0845 02/27/13 1000 02/27/13 1015 02/27/13 1029  BP: 123/81 114/78 122/68 118/67  Pulse: 89 81 76 84  Temp:    100.7 F (38.2 C)  TempSrc:      Resp: 15 15 20 18   Height:     5\' 7"  (1.702 m)  Weight:    184 lb 3.2 oz (83.553 kg)  SpO2: 98% 98% 97% 100%   Physical Exam: General: Vital signs reviewed and noted. Well-developed, well-nourished, in no acute distress; alert, appropriate and cooperative throughout examination.  Head: Normocephalic, atraumatic.  Eyes: PERRL, EOMI, No signs of anemia or jaundince.  Nose: Mucous membranes moist, not inflammed, nonerythematous.  Throat: Oropharynx nonerythematous, no exudate appreciated.   Neck: No deformities, masses, or tenderness noted.Supple, No carotid Bruits, no JVD.  Lungs:  Normal respiratory effort. Clear to auscultation BL without crackles or wheezes.  Heart: RRR. S1 and S2 normal without gallop, murmur, or rubs.  Abdomen:  BS normoactive. Soft, Nondistended, non-tender.  No masses or organomegaly.  Extremities: No pretibial edema.  Neurologic: A&O X3, CN II - XII are grossly intact. Motor strength is 5/5 in the all 4 extremities, Sensations intact to light touch, Cerebellar signs negative.  Skin: No visible rashes, scars.    Lab results:   Basic Metabolic Panel:  Recent Labs  40/98/11 0641  NA 136  K 3.8  CL 100  CO2 25  GLUCOSE 109*  BUN 9  CREATININE 0.98  CALCIUM 9.4   CBC:  Recent Labs  02/27/13 0641  WBC 11.0*  HGB 15.3  HCT 42.5  MCV 81.3  PLT 186   Urine Drug Screen: Negative for cocaine  Alcohol Level: No results found for this basename: ETH,  in the last 72 hours Urinalysis: Color, Urine YELLOW YELLOW Final APPearance CLEAR CLEAR Final Specific Gravity, Urine 1.020 1.005 - 1.030 Final pH 5.5 5.0 - 8.0 Final Glucose, UA 100 (A) NEGATIVE mg/dL Final Hgb urine dipstick NEGATIVE NEGATIVE Final Bilirubin Urine NEGATIVE NEGATIVE Final Ketones, ur NEGATIVE NEGATIVE mg/dL Final Protein, ur NEGATIVE NEGATIVE mg/dL Final Urobilinogen, UA 0.2 0.0 - 1.0 mg/dL Final Nitrite NEGATIVE NEGATIVE Final Leukocytes, UA NEGATIVE NEGATIVE Final MICROSCOPIC NOT DONE ON URINES WITH NEGATIVE PROTEIN,  BLOOD, LEUKOCYTES, NITRITE, OR GLUCOSE <1000    Imaging results:  Dg Chest 2 View  02/27/2013   *RADIOLOGY REPORT*  Clinical Data: Chest pain.  CHEST - 2 VIEW  Comparison: 09/15/2007  Findings: The heart size and pulmonary vascularity are normal and the lungs are clear.  No acute osseous abnormality.  Slight thoracic scoliosis.  IMPRESSION: No acute disease.  Of the   Original Report Authenticated By: Francene Boyers, M.D.    Other results: EKG: 84 bpm, nsr, normal axis, normal intervals, TWI in inferior leads and some repolarization abnormalities in anterior precordial leads could be from LVH  Assessment & Plan by Problem:  Principal Problem:   Chest pain Active Problems:   Headache   Cough   Tobacco abuse   Hx of cocaine abuse  Patient is 52 year old man with symptoms consistent with typical chest pain for ACS. He does have risk factors which include smoking, cocaine use and not on any medications for lipid control(limited medical care). Patient's symptoms are concerning for unstable angina given his risk factors and EKG changes.  -Admit to telemetry -high dose statins -aspirin -heparin drip -avoid betablockers due to cocaine use history -calcium channel blockers -nitro, oxygen and morphine as needed -morning ekg -cardiology consult -Hba1c, lipid panel, tsh for risk stratification.  Rest of the medical management as per resident note.

## 2013-02-27 NOTE — Progress Notes (Signed)
ANTICOAGULATION CONSULT NOTE - Initial Consult  Pharmacy Consult for IV heparin Indication: r/o ACS - STEMI  No Known Allergies  Patient Measurements: Height: 5\' 7"  (170.2 cm) Weight: 184 lb 3.2 oz (83.553 kg) IBW/kg (Calculated) : 66.1 Heparin Dosing Weight: 83 kg   Vital Signs: Temp: 101.1 F (38.4 C) (06/03 1814) BP: 114/66 mmHg (06/03 1420) Pulse Rate: 81 (06/03 1420)   Recent Labs  02/27/13 0641 02/27/13 1300 02/27/13 1618 02/27/13 1809  HGB 15.3  --   --   --   HCT 42.5  --   --   --   PLT 186  --   --   --   HEPARINUNFRC  --   --   --  <0.10*  CREATININE 0.98  --   --   --   TROPONINI  --  <0.30 <0.30  --     Estimated Creatinine Clearance: 92.2 ml/min (by C-G formula based on Cr of 0.98).   Medical History: Past Medical History  Diagnosis Date  . Chest pain at rest 02/26/2013  . [id number](540.9)      Assessment: 52yo male presenting with chest pain, diaphoresis, nausea, and T-wave inversions being started on IV heparin for rule out ACS/STEMI. Hgb 15.3, Plts 186, Scr 0.98.  First heparin level <0.1   Goal of Therapy:  Heparin level 0.3-0.7 units/ml Monitor platelets by anticoagulation protocol: Yes    Plan:  1) Heparin IV 3000 unit bolus x 1 2) Increase IV heparin to 1400 units/hr 3) Check 6 hour heparin level, then daily HL & CBC while on heparin 4) Monitor signs/symptoms of bleeding  5) F/u cardiac markers/plans   Celedonio Miyamoto, PharmD, BCPS Clinical Pharmacist Pager [phone]   02/27/2013   7:33 PM

## 2013-02-27 NOTE — Progress Notes (Signed)
INTERNAL MEDICINE TEACHING SERVICE [facility] Progress Note   Subjective:    We evaluated the patient for fevers. He reports that he has sore throat for the last three days. No history of sick contact. No photophobia, neck pain for back pain. It hurts when he swallows. No history of confusion. He denies cough, nasal congestion or increased mucus production. He however, reports headache.   He reports that his chest pain has resolved for now.    Objective:    BP 114/66  Pulse 81  Temp(Src) 101.1 F (38.4 C) (Oral)  Resp 18  Ht 5\' 7"  (1.702 m)  Wt 184 lb 3.2 oz (83.553 kg)  BMI 28.84 kg/m2  SpO2 99%   Labs: Basic Metabolic Panel:    Component Value Date/Time   NA 136 02/27/2013 0641   K 3.8 02/27/2013 0641   CL 100 02/27/2013 0641   CO2 25 02/27/2013 0641   BUN 9 02/27/2013 0641   CREATININE 0.98 02/27/2013 0641   GLUCOSE 109* 02/27/2013 0641   CALCIUM 9.4 02/27/2013 0641    CBC:    Component Value Date/Time   WBC 11.0* 02/27/2013 0641   HGB 15.3 02/27/2013 0641   HCT 42.5 02/27/2013 0641   PLT 186 02/27/2013 0641   MCV 81.3 02/27/2013 0641    Cardiac Enzymes: Lab Results  Component Value Date   TROPONINI <0.30 02/27/2013    Physical Exam: General:     Vital signs reviewed and noted. Well-developed, well-nourished, in no acute distress; alert, appropriate and cooperative throughout examination. Bilateral anterior cervical small and tender lymphadenopathy.  No oropharyngeal exudates.   Neurologic:  A&O x 3. Neck is supple. PEARL bilaterally, Kernig's sign - negative. DTR normal.   Lungs:  Normal respiratory effort. Clear to auscultation BL without crackles or wheezes.  Heart: RRR. S1 and S2 normal without gallop, murmur, or rubs.  Abdomen:  BS normoactive. Soft, Nondistended, non-tender.  No masses or organomegaly.  Extremities: No pretibial edema.     Assessment/ Plan:    Acute pharyngitis: Centor score - 2 points. Probability of Strep Pharyngitis: 11% - 17%. Meningitis  unlikely.  Plan - Throat Culture after the rapid strep test  - will consider antibiotics only for positive culture results. - symptomatic management for now with Cepacol lozenges   Signed:  Dow Adolph, MD PGY-1 Internal Medicine Teaching Service Pager: [phone]  02/27/2013, 8:51 PM

## 2013-02-27 NOTE — ED Provider Notes (Signed)
History     CSN: [id number]  Arrival date & time 02/27/13  1027   First MD Initiated Contact with Patient 02/27/13 [phone]      Chief Complaint  Patient presents with  . Chest Pain    (Consider location/radiation/quality/duration/timing/severity/associated sxs/prior treatment) HPI Comments: Patient presents emergency department with chief complaint of chest pain times several hours. He states that he first noticed the chest pain at around 2 AM while lying in bed. He endorses some mild associated shortness of breath and diaphoresis. He states that currently, he is no longer having any chest pain. He has a 25-pack-year history of smoking, but does not have any other risk factors. Additionally, he is complaining of throat pain x2-3 days. Denies fevers, chills, nausea, or vomiting.  The history is provided by the patient. No language interpreter was used.    History reviewed. No pertinent past medical history.  History reviewed. No pertinent past surgical history.  No family history on file.  History  Substance Use Topics  . Smoking status: Current Every Day Smoker    Types: Cigarettes  . Smokeless tobacco: Not on file  . Alcohol Use: Yes      Review of Systems  All other systems reviewed and are negative.    Allergies  Review of patient's allergies indicates no known allergies.  Home Medications   Current Outpatient Rx  Name  Route  Sig  Dispense  Refill  . imiquimod (ALDARA) 5 % cream   Topical   Apply topically 3 (three) times a week. Apply MWF for up to 16 weeks as needed   12 each   0   . naproxen (NAPROSYN) 500 MG tablet   Oral   Take 1 tablet (500 mg total) by mouth 2 (two) times daily.   30 tablet   0   . naproxen (NAPROSYN) 500 MG tablet   Oral   Take 1 tablet (500 mg total) by mouth 2 (two) times daily.   60 tablet   0     There were no vitals taken for this visit.  Physical Exam  Nursing note and vitals reviewed. Constitutional: He is  oriented to person, place, and time. He appears well-developed and well-nourished.  HENT:  Head: Normocephalic and atraumatic.  Eyes: Conjunctivae and EOM are normal. Pupils are equal, round, and reactive to light. Right eye exhibits no discharge. Left eye exhibits no discharge. No scleral icterus.  Neck: Normal range of motion. Neck supple. No JVD present.  Cardiovascular: Normal rate, regular rhythm, normal heart sounds and intact distal pulses.  Exam reveals no gallop and no friction rub.   No murmur heard. Pulmonary/Chest: Effort normal and breath sounds normal. No respiratory distress. He has no wheezes. He has no rales. He exhibits no tenderness.  Abdominal: Soft. Bowel sounds are normal. He exhibits no distension and no mass. There is no tenderness. There is no rebound and no guarding.  Musculoskeletal: Normal range of motion. He exhibits no edema and no tenderness.  Neurological: He is alert and oriented to person, place, and time. He has normal reflexes.  CN 3-12 intact  Skin: Skin is warm and dry.  Psychiatric: He has a normal mood and affect. His behavior is normal. Judgment and thought content normal.    ED Course  Procedures (including critical care time)  Labs Reviewed  CBC  BASIC METABOLIC PANEL  PRO B NATRIURETIC PEPTIDE   Results for orders placed during the hospital encounter of 02/27/13  CBC  Result Value Range   WBC 11.0 (*) 4.0 - 10.5 K/uL   RBC 5.23  4.22 - 5.81 MIL/uL   Hemoglobin 15.3  13.0 - 17.0 g/dL   HCT 16.1  09.6 - 04.5 %   MCV 81.3  78.0 - 100.0 fL   MCH 29.3  26.0 - 34.0 pg   MCHC 36.0  30.0 - 36.0 g/dL   RDW 40.9  81.1 - 91.4 %   Platelets 186  150 - 400 K/uL  BASIC METABOLIC PANEL      Result Value Range   Sodium 136  135 - 145 mEq/L   Potassium 3.8  3.5 - 5.1 mEq/L   Chloride 100  96 - 112 mEq/L   CO2 25  19 - 32 mEq/L   Glucose, Bld 109 (*) 70 - 99 mg/dL   BUN 9  6 - 23 mg/dL   Creatinine, Ser 7.82  0.50 - 1.35 mg/dL   Calcium 9.4   8.4 - 95.6 mg/dL   GFR calc non Af Amer >90  >90 mL/min   GFR calc Af Amer >90  >90 mL/min  PRO B NATRIURETIC PEPTIDE      Result Value Range   Pro B Natriuretic peptide (BNP) 61.2  0 - 125 pg/mL  POCT I-STAT TROPONIN I      Result Value Range   Troponin i, poc 0.00  0.00 - 0.08 ng/mL   Comment 3            Dg Chest 2 View  02/27/2013   *RADIOLOGY REPORT*  Clinical Data: Chest pain.  CHEST - 2 VIEW  Comparison: 09/15/2007  Findings: The heart size and pulmonary vascularity are normal and the lungs are clear.  No acute osseous abnormality.  Slight thoracic scoliosis.  IMPRESSION: No acute disease.  Of the   Original Report Authenticated By: Francene Boyers, M.D.     ED ECG REPORT  I personally interpreted this EKG   Date: 02/27/2013   Rate: 84  Rhythm: normal sinus rhythm  QRS Axis: normal  Intervals: normal  ST/T Wave abnormalities: Diffuse T-wave inversion  Conduction Disutrbances:none  Narrative Interpretation:   Old EKG Reviewed: none available    1. Chest pain       MDM  Patient with chest pain times several hours.   Will check basic labs.  8:16 AM Discussed with Dr. Bernette Mayers, who recommends admit for ACS rule out based on history and EKG findings.  Discussed with internal medicine residents, who will admit the patient.        Roxy Horseman, PA-C 02/27/13 [phone]

## 2013-02-28 ENCOUNTER — Encounter (HOSPITAL_COMMUNITY): Payer: Self-pay | Admitting: Cardiology

## 2013-02-28 ENCOUNTER — Encounter (HOSPITAL_COMMUNITY)
Admission: EM | Disposition: A | Discharge status: Home / Self Care | Payer: Self-pay | Source: Home / Self Care | Attending: Internal Medicine

## 2013-02-28 DIAGNOSIS — R9431 Abnormal electrocardiogram [ECG] [EKG]: Secondary | ICD-10-CM

## 2013-02-28 DIAGNOSIS — Z9889 Other specified postprocedural states: Secondary | ICD-10-CM

## 2013-02-28 DIAGNOSIS — J020 Streptococcal pharyngitis: Secondary | ICD-10-CM | POA: Diagnosis present

## 2013-02-28 DIAGNOSIS — R079 Chest pain, unspecified: Secondary | ICD-10-CM

## 2013-02-28 HISTORY — PX: LEFT HEART CATHETERIZATION WITH CORONARY ANGIOGRAM: SHX5451

## 2013-02-28 HISTORY — PX: CARDIAC CATHETERIZATION: SHX172

## 2013-02-28 HISTORY — DX: Other specified postprocedural states: Z98.890

## 2013-02-28 HISTORY — DX: Abnormal electrocardiogram (ECG) (EKG): R94.31

## 2013-02-28 LAB — CBC
HCT: 39.9 % (ref 39.0–52.0)
Hemoglobin: 14 g/dL (ref 13.0–17.0)
MCV: 80.9 fL (ref 78.0–100.0)
RBC: 4.93 MIL/uL (ref 4.22–5.81)
WBC: 14 10*3/uL — ABNORMAL HIGH (ref 4.0–10.5)

## 2013-02-28 LAB — PROTIME-INR
INR: 1.05 (ref 0.00–1.49)
Prothrombin Time: 13.6 seconds (ref 11.6–15.2)

## 2013-02-28 LAB — HEPARIN LEVEL (UNFRACTIONATED): Heparin Unfractionated: 0.31 IU/mL (ref 0.30–0.70)

## 2013-02-28 SURGERY — LEFT HEART CATHETERIZATION WITH CORONARY ANGIOGRAM
Anesthesia: LOCAL

## 2013-02-28 MED ORDER — ACETAMINOPHEN 325 MG PO TABS
650.0000 mg | ORAL_TABLET | ORAL | Status: DC | PRN
  Administered 2013-02-28: 650 mg via ORAL
  Filled 2013-02-28: qty 2

## 2013-02-28 MED ORDER — MIDAZOLAM HCL 2 MG/2ML IJ SOLN
INTRAMUSCULAR | Status: AC
  Filled 2013-02-28: qty 2

## 2013-02-28 MED ORDER — ATORVASTATIN CALCIUM 10 MG PO TABS: 10.0000 mg | ORAL_TABLET | Freq: Every day | ORAL | Status: DC

## 2013-02-28 MED ORDER — ASPIRIN 81 MG PO CHEW
324.0000 mg | CHEWABLE_TABLET | ORAL | Status: AC
  Administered 2013-02-28: 324 mg via ORAL
  Filled 2013-02-28: qty 4

## 2013-02-28 MED ORDER — SODIUM CHLORIDE 0.9 % IJ SOLN
3.0000 mL | Freq: Two times a day (BID) | INTRAMUSCULAR | Status: DC
  Administered 2013-02-28: 3 mL via INTRAVENOUS

## 2013-02-28 MED ORDER — AMOXICILLIN 500 MG PO CAPS
500.0000 mg | ORAL_CAPSULE | Freq: Two times a day (BID) | ORAL | Status: DC
  Administered 2013-02-28 – 2013-03-01 (×4): 500 mg via ORAL
  Filled 2013-02-28 (×5): qty 1

## 2013-02-28 MED ORDER — HEPARIN SODIUM (PORCINE) 1000 UNIT/ML IJ SOLN
INTRAMUSCULAR | Status: AC
  Filled 2013-02-28: qty 1

## 2013-02-28 MED ORDER — SODIUM CHLORIDE 0.9 % IV SOLN
INTRAVENOUS | Status: AC
  Administered 2013-02-28: 17:00:00 via INTRAVENOUS

## 2013-02-28 MED ORDER — SODIUM CHLORIDE 0.9 % IV SOLN
INTRAVENOUS | Status: DC
  Administered 2013-02-28: 10:00:00 via INTRAVENOUS

## 2013-02-28 MED ORDER — ONDANSETRON HCL 4 MG/2ML IJ SOLN: 4.0000 mg | Freq: Four times a day (QID) | INTRAMUSCULAR | Status: DC | PRN

## 2013-02-28 MED ORDER — DILTIAZEM HCL ER COATED BEADS 120 MG PO CP24: 120.0000 mg | ORAL_CAPSULE | Freq: Every day | ORAL | Status: DC

## 2013-02-28 MED ORDER — SODIUM CHLORIDE 0.9 % IV SOLN: 250.0000 mL | INTRAVENOUS | Status: DC | PRN

## 2013-02-28 MED ORDER — MORPHINE SULFATE 2 MG/ML IJ SOLN: 1.0000 mg | INTRAMUSCULAR | Status: DC | PRN

## 2013-02-28 MED ORDER — HEPARIN BOLUS VIA INFUSION
1200.0000 [IU] | Freq: Once | INTRAVENOUS | Status: AC
  Administered 2013-02-28: 1200 [IU] via INTRAVENOUS
  Filled 2013-02-28: qty 1200

## 2013-02-28 MED ORDER — VERAPAMIL HCL 2.5 MG/ML IV SOLN
INTRAVENOUS | Status: AC
  Filled 2013-02-28: qty 2

## 2013-02-28 MED ORDER — FENTANYL CITRATE 0.05 MG/ML IJ SOLN
INTRAMUSCULAR | Status: AC
  Filled 2013-02-28: qty 2

## 2013-02-28 MED ORDER — LIDOCAINE HCL (PF) 1 % IJ SOLN
INTRAMUSCULAR | Status: AC
  Filled 2013-02-28: qty 30

## 2013-02-28 MED ORDER — HEPARIN (PORCINE) IN NACL 2-0.9 UNIT/ML-% IJ SOLN
INTRAMUSCULAR | Status: AC
  Filled 2013-02-28: qty 1000

## 2013-02-28 MED ORDER — ASPIRIN 81 MG PO CHEW
81.0000 mg | CHEWABLE_TABLET | Freq: Every day | ORAL | Status: DC
  Administered 2013-03-01: 81 mg via ORAL
  Filled 2013-02-28: qty 1

## 2013-02-28 MED ORDER — SODIUM CHLORIDE 0.9 % IJ SOLN: 3.0000 mL | INTRAMUSCULAR | Status: DC | PRN

## 2013-02-28 MED ORDER — AMOXICILLIN 500 MG PO CAPS: 500.0000 mg | ORAL_CAPSULE | Freq: Two times a day (BID) | ORAL | Status: AC

## 2013-02-28 NOTE — Progress Notes (Signed)
Pt lives at halfway house, needs SW to arrange return. Will d/c tomorrow.

## 2013-02-28 NOTE — H&P (Signed)
    Pt was reexamined and existing H & P reviewed. No changes found.  Runell Gess, MD [facility] 02/28/2013 1:44 PM

## 2013-02-28 NOTE — Progress Notes (Signed)
ANTICOAGULATION CONSULT NOTE  Pharmacy Consult for IV heparin Indication: r/o ACS - STEMI  No Known Allergies  Patient Measurements: Height: 5\' 7"  (170.2 cm) Weight: 179 lb (81.194 kg) IBW/kg (Calculated) : 66.1 Heparin Dosing Weight: 81 kg   Vital Signs: Temp: 100 F (37.8 C) (06/04 0458) Temp src: Oral (06/04 0458) BP: 135/63 mmHg (06/04 0458) Pulse Rate: 78 (06/04 0458)   Recent Labs  02/27/13 0641 02/27/13 1300 02/27/13 1618 02/27/13 1809 02/27/13 2150 02/28/13 0140 02/28/13 0935  HGB 15.3  --   --   --   --  14.0  --   HCT 42.5  --   --   --   --  39.9  --   PLT 186  --   --   --   --  188  --   LABPROT  --   --   --   --   --   --  13.6  INR  --   --   --   --   --   --  1.05  HEPARINUNFRC  --   --   --  <0.10*  --  0.31 0.26*  CREATININE 0.98  --   --   --   --   --   --   TROPONINI  --  <0.30 <0.30  --  <0.30  --   --     Estimated Creatinine Clearance: 90.9 ml/min (by C-G formula based on Cr of 0.98).  Assessment: 52 yo male continuing on IV heparin - plans for cath later today. Repeat heparin level a little below goal at 0.26 at rate of 1400 units/hr, Hgb 14.0, Plts 188, with no issues reported by RN and not bleeding issues noted in chart.   Goal of Therapy:  Heparin level 0.3-0.7 units/ml Monitor platelets by anticoagulation protocol: Yes   Plan:  1) IV Heparin bolus 1200 units x 1 2) Increase IV Heparin to 1550 units/hr 3) Check 6 hour heparin level, then daily heparin level & CBC (if continue post-cath) 4) F/u plans post-cath    Benjaman Pott, PharmD, BCPS 02/28/2013   11:28 AM

## 2013-02-28 NOTE — Progress Notes (Signed)
[facility]  Subjective: No further CP.  Objective: Vital signs in last 24 hours: Temp:  [98.9 F (37.2 C)-101.5 F (38.6 C)] 100 F (37.8 C) (06/04 0458) Pulse Rate:  [76-92] 78 (06/04 0458) Resp:  [15-20] 18 (06/03 1420) BP: (114-135)/(63-78) 135/63 mmHg (06/04 0458) SpO2:  [93 %-100 %] 93 % (06/04 0458) Weight:  [179 lb (81.194 kg)-184 lb 3.2 oz (83.553 kg)] 179 lb (81.194 kg) (06/04 0458) Last BM Date: 02/26/13  Intake/Output from previous day: 06/03 0701 - 06/04 0700 In: 220 [P.O.:220] Out: 2415 [Urine:2415] Intake/Output this shift: Total I/O In: -  Out: 300 [Urine:300]  Medications Current Facility-Administered Medications  Medication Dose Route Frequency Provider Last Rate Last Dose  . acetaminophen (TYLENOL) tablet 650 mg  650 mg Oral Q6H PRN Leodis Sias, MD   650 mg at 02/28/13 6295   Or  . acetaminophen (TYLENOL) suppository 650 mg  650 mg Rectal Q6H PRN Leodis Sias, MD      . amoxicillin (AMOXIL) capsule 500 mg  500 mg Oral Q12H Belia Heman, MD   500 mg at 02/28/13 0212  . aspirin EC tablet 81 mg  81 mg Oral Daily Leodis Sias, MD      . atorvastatin (LIPITOR) tablet 80 mg  80 mg Oral q1800 Lars Mage, MD   80 mg at 02/27/13 1506  . diltiazem (CARDIZEM) tablet 30 mg  30 mg Oral Q6H Ankit Garg, MD   30 mg at 02/28/13 0647  . heparin ADULT infusion 100 units/mL (25000 units/250 mL)  1,400 Units/hr Intravenous Continuous Leodis Sias, MD 14 mL/hr at 02/28/13 0213 1,400 Units/hr at 02/28/13 0213  . menthol-cetylpyridinium (CEPACOL) lozenge 3 mg  1 lozenge Oral PRN Dow Adolph, MD      . morphine 2 MG/ML injection 2-4 mg  2-4 mg Intravenous Q4H PRN Leodis Sias, MD   2 mg at 02/27/13 1814  . nitroGLYCERIN (NITROSTAT) SL tablet 0.4 mg  0.4 mg Sublingual Q5 min PRN Charles B. Bernette Mayers, MD   0.4 mg at 02/27/13 0827  . nitroGLYCERIN (NITROSTAT) SL tablet 0.4 mg  0.4 mg Sublingual Q5 min PRN Lars Mage, MD      . ondansetron (ZOFRAN) tablet 4 mg  4 mg Oral Q6H PRN Leodis Sias, MD       Or  . ondansetron [facility]) injection 4 mg  4 mg Intravenous Q6H PRN Leodis Sias, MD      . sodium chloride 0.9 % injection 3 mL  3 mL Intravenous Q12H Leodis Sias, MD   3 mL at 02/28/13 0000    PE: General appearance: alert, cooperative and no distress Lungs: clear to auscultation bilaterally Heart: regular rate and rhythm, S1, S2 normal, no murmur, click, rub or gallop Extremities: No LEE Pulses: 2+ and symmetric Neurologic: Grossly normal  Lab Results:   Recent Labs  02/27/13 0641 02/28/13 0140  WBC 11.0* 14.0*  HGB 15.3 14.0  HCT 42.5 39.9  PLT 186 188   BMET  Recent Labs  02/27/13 0641  NA 136  K 3.8  CL 100  CO2 25  GLUCOSE 109*  BUN 9  CREATININE 0.98  CALCIUM 9.4   PT/INR No results found for this basename: LABPROT, INR,  in the last 72 hours Cholesterol  Recent Labs  02/27/13 1254  CHOL 189   Lipid Panel     Component Value Date/Time   CHOL 189 02/27/2013 1254   TRIG 39 02/27/2013 1254   HDL 57 02/27/2013  1254   CHOLHDL 3.3 02/27/2013 1254   VLDL 8 02/27/2013 1254   LDLCALC 124* 02/27/2013 1254     Cardiac Panel (last 3 results)  Recent Labs  02/27/13 1300 02/27/13 1618 02/27/13 2150  TROPONINI <0.30 <0.30 <0.30    Studies/Results: @RISRSLT2 @   Assessment/Plan  Principal Problem:   Chest pain Active Problems:   Headache   Cough   Tobacco abuse   Hx of cocaine abuse   Acute streptococcal pharyngitis  Plan:  Left heart cath with possible PCI.  No further CP.  BP and HR controlled.  ASA, lipitor, cardizem.  No BB due to history of cocaine.   LOS: 1 day    HAGER, BRYAN 02/28/2013 9:15 AM   Agree with note written by [facility]  [place] with + CRF and EKG changes notable for inferolateral TWI. Exam benign. Labs OK. Enz neg.On IV hep. For cath today. Can be done radially. Risks and benefits  explained.   Runell Gess 02/28/2013 9:38 AM

## 2013-02-28 NOTE — Progress Notes (Signed)
Subjective: Pt had several fevers overnight Tmax 101.50F only symptom was cough/sore throat. Rapid strep + and Abx started pt feeling better this AM. Scheduled for LHC w/possible PCI this AM. HR and BP under good control pt still on heparin. No active CP today.   Objective: Vital signs in last 24 hours: Filed Vitals:   02/27/13 1420 02/27/13 1814 02/27/13 2041 02/28/13 0458  BP: 114/66  124/69 135/63  Pulse: 81  92 78  Temp: 101.5 F (38.6 C) 101.1 F (38.4 C) 98.9 F (37.2 C) 100 F (37.8 C)  TempSrc:   Axillary Oral  Resp: 18     Height:      Weight:    179 lb (81.194 kg)  SpO2: 99%  94% 93%   Weight change:   Intake/Output Summary (Last 24 hours) at 02/28/13 0819 Last data filed at 02/28/13 0753  Gross per 24 hour  Intake    220 ml  Output   2715 ml  Net  -2495 ml   General: resting in bed, NAD HEENT: PERRL, EOMI, no scleral icterus, MMM, no tonsillar exudates Cardiac: RRR, no rubs, murmurs or gallops Pulm: clear to auscultation bilaterally, moving normal volumes of air Abd: soft, nontender, nondistended, BS present Ext: warm and well perfused, no pedal edema Neuro: alert and oriented X3, cranial nerves II-XII grossly intact  Lab Results: Basic Metabolic Panel:  Recent Labs Lab 02/27/13 0641  NA 136  K 3.8  CL 100  CO2 25  GLUCOSE 109*  BUN 9  CREATININE 0.98  CALCIUM 9.4   Liver Function Tests:  Recent Labs Lab 02/27/13 1300  AST 17  ALT 17  ALKPHOS 89  BILITOT 0.2*  PROT 7.3  ALBUMIN 3.1*   CBC:  Recent Labs Lab 02/27/13 0641 02/27/13 2017 02/28/13 0140  WBC 11.0*  --  14.0*  NEUTROABS  --  8.2*  --   HGB 15.3  --  14.0  HCT 42.5  --  39.9  MCV 81.3  --  80.9  PLT 186  --  188   Cardiac Enzymes:  Recent Labs Lab 02/27/13 1300 02/27/13 1618 02/27/13 2150  TROPONINI <0.30 <0.30 <0.30   BNP:  Recent Labs Lab 02/27/13 0640  PROBNP 61.2   Hemoglobin A1C:  Recent Labs Lab 02/27/13 1300  HGBA1C 5.5   Fasting Lipid  Panel:  Recent Labs Lab 02/27/13 1254  CHOL 189  HDL 57  LDLCALC 124*  TRIG 39  CHOLHDL 3.3   Thyroid Function Tests:  Recent Labs Lab 02/27/13 1300  TSH 0.407   Urine Drug Screen: Drugs of Abuse     Component Value Date/Time   LABOPIA NONE DETECTED 02/27/2013 0958   COCAINSCRNUR NONE DETECTED 02/27/2013 0958   LABBENZ NONE DETECTED 02/27/2013 0958   AMPHETMU NONE DETECTED 02/27/2013 0958   THCU NONE DETECTED 02/27/2013 0958   LABBARB NONE DETECTED 02/27/2013 0958    Urinalysis:  Recent Labs Lab 02/23/13 1507 02/27/13 0958  COLORURINE  --  YELLOW  LABSPEC >=1.030 1.020  PHURINE 5.5 5.5  GLUCOSEU NEGATIVE 100*  HGBUR NEGATIVE NEGATIVE  BILIRUBINUR NEGATIVE NEGATIVE  KETONESUR NEGATIVE NEGATIVE  PROTEINUR NEGATIVE NEGATIVE  UROBILINOGEN 0.2 0.2  NITRITE NEGATIVE NEGATIVE  LEUKOCYTESUR NEGATIVE NEGATIVE   Micro Results: Recent Results (from the past 240 hour(s))  RAPID STREP SCREEN     Status: Abnormal   Collection Time    02/27/13  8:05 PM      Result Value Range Status   Streptococcus, Group A Screen (  Direct) POSITIVE (*) NEGATIVE Final   Studies/Results: Dg Chest 2 View  02/27/2013   *RADIOLOGY REPORT*  Clinical Data: Chest pain.  CHEST - 2 VIEW  Comparison: 09/15/2007  Findings: The heart size and pulmonary vascularity are normal and the lungs are clear.  No acute osseous abnormality.  Slight thoracic scoliosis.  IMPRESSION: No acute disease.  Of the   Original Report Authenticated By: Francene Boyers, M.D.   Medications: I have reviewed the patient's current medications. Scheduled Meds: . amoxicillin  500 mg Oral Q12H  . aspirin EC  81 mg Oral Daily  . atorvastatin  80 mg Oral q1800  . diltiazem  30 mg Oral Q6H  . sodium chloride  3 mL Intravenous Q12H   Continuous Infusions: . heparin 1,400 Units/hr (02/28/13 0213)   PRN Meds:.acetaminophen, acetaminophen, menthol-cetylpyridinium, morphine injection, nitroGLYCERIN, nitroGLYCERIN, ondansetron (ZOFRAN) IV,  ondansetron Assessment/Plan: #CP: He is a former smoker as well as former cocaine user who has been clean for 4 weeks and current UDS is negative. He has no history of hypertension, diabetes, or hypercholesterolemia. He also has no family history of heart disease per his report. All trops have been negative but admission EKG is positive for T wave inversions in II, III, aVF, V4, V5, and V6, Left atrial enlargement and LVH.Chest x-ray is clear of infiltrates but he does have a mild fever, cough, and a mildly elevated WBC count. He has received 3 nitroglycerin in the ED which did not relieve his pain. The pain did respond to 2 mg of morphine.  -cardiology consulted and will perform LHC w/possible PCI on 02/28/13 - Heparin gtt continued - ASA 81 mg   #Acute strep pharyngitis: most likely etiology for fevers. Rapid strep positive.  -Amoxicillin  -throat culture pending but rapid strep positive  Dispo: Disposition is deferred at this time, awaiting improvement of current medical problems.  Anticipated discharge in approximately 2-3 day(s).   The patient does not have a current PCP (No PCP Per Patient), therefore will not be requiring OPC follow-up after discharge.   The patient does not have transportation limitations that hinder transportation to clinic appointments.  .Services Needed at time of discharge: Y = Yes, Blank = No PT:   OT:   RN:   Equipment:   Other:     LOS: 1 day   Christen Bame, MD 02/28/2013, 8:19 AM Pgr: [phone]

## 2013-02-28 NOTE — Progress Notes (Addendum)
CSW received referral to meet with to return to [facility]. CSW called the group home to see if they provide transportation back to the home and if they need anything else to assist in him returning. CSW spoke with rep from [facility] and they will provide transportation for the pt to return. RN will let CSW know when the pt is ready for d/c and the phone call will be made.    Sherald Barge, LCSW-A Clinical Social Worker [phone]

## 2013-02-28 NOTE — Progress Notes (Signed)
ANTICOAGULATION CONSULT NOTE  Pharmacy Consult for IV heparin Indication: r/o ACS - STEMI  No Known Allergies  Patient Measurements: Height: 5\' 7"  (170.2 cm) Weight: 184 lb 3.2 oz (83.553 kg) IBW/kg (Calculated) : 66.1 Heparin Dosing Weight: 83 kg   Vital Signs: Temp: 98.9 F (37.2 C) (06/03 2041) Temp src: Axillary (06/03 2041) BP: 124/69 mmHg (06/03 2041) Pulse Rate: 92 (06/03 2041)   Recent Labs  02/27/13 0641 02/27/13 1300 02/27/13 1618 02/27/13 1809 02/27/13 2150 02/28/13 0140  HGB 15.3  --   --   --   --  14.0  HCT 42.5  --   --   --   --  39.9  PLT 186  --   --   --   --  188  HEPARINUNFRC  --   --   --  <0.10*  --  0.31  CREATININE 0.98  --   --   --   --   --   TROPONINI  --  <0.30 <0.30  --  <0.30  --     Estimated Creatinine Clearance: 92.2 ml/min (by C-G formula based on Cr of 0.98).  Assessment: 52 yo male with chest pain for heparin  Goal of Therapy:  Heparin level 0.3-0.7 units/ml Monitor platelets by anticoagulation protocol: Yes   Plan:  Continue Heparin at current rate  Geannie Risen, PharmD, BCPS  02/28/2013   2:21 AM

## 2013-02-28 NOTE — CV Procedure (Signed)
Ivan Murphy is a 52 y.o. male    [id number] LOCATION:  FACILITY: [facility]  PHYSICIAN: Nanetta Batty, M.D. [birth year]   DATE OF PROCEDURE:  02/28/2013  DATE OF DISCHARGE:  [facility]  CARDIAC CATHETERIZATION     History obtained from chart review.The patient is a 52 year old African American male with history tobacco abuse since age 89 and cocaine use. He apparently has not had any in the last month. He has no prior cardiac history and no family history of heart disease that he knows of. Patient reports development of chest pain last night between 12 midnight and a 0100 hours after he got up to go the bathroom. He indicated the pain did not wake him from sleep. He states that pain was 7-8/10 in intensity and felt like"Something squeezing me". He also reports nausea and diaphoresis but no radiation of pain to arm, neck, back or jaw. He states says his shirt was so wet he had to change it. He also reports that the pain was exacerbated with movement and inspiration, but that it also was improved with the second sublingual nitroglycerin which was given in the ER. He is currently pain-free. He ruled out for myocardial infarction. He did have inferolateral T-wave inversion. He presents now for cardiac catheterization to define his anatomy and rule out an ischemic etiology    PROCEDURE DESCRIPTION:    The patient was brought to the second floor [place] Cardiac cath lab in the postabsorptive state. He was premedicated with Valium 5 mg by mouth, IV Versed and fentanyl. His right wristwas prepped and shaved in usual sterile fashion. Xylocaine 1% was used for local anesthesia. A 5 French sheath was inserted into the right radial artery using standard Seldinger technique. The patient received 4000 units  of heparin  intravenously.  A 5 [place] TIG catheter along with a 5 French pigtail catheter were used for selective artery angiography and left ventriculography respectively.  Visipaque dye was used for the entirety of the case. Retrograde aortic, left ventricular and pulmonary pressures were recorded.    HEMODYNAMICS:    AO SYSTOLIC/AO DIASTOLIC: 113/72   LV SYSTOLIC/LV DIASTOLIC: 113/6  ANGIOGRAPHIC RESULTS:   1. Left main; normal  2. LAD; normal 3. Left circumflex; normal.  4. Right coronary artery; dominant and normal 5. Left ventriculography; RAO left ventriculogram was performed using  25 mL of Visipaque dye at 12 mL/second. The overall LVEF estimated  60 %  Without wall motion abnormalities  IMPRESSION:Ivan Murphy has normal coronary arteries and normal left ventricular function. I believe this chest pain is noncardiac. I suspect his T wave inversion is related to repolarization changes from hypertension. Continued medical therapy will be recommended. The sheath was removed and a TR band was placed on the right wrist chief patent hemostasis. The patient left the Cath Lab in stable condition. He'll be discharged home in approximately 2-3 hours will be seen by mid-level provider in our practice in one to 2 weeks.  Runell Gess MD, [facility] 02/28/2013 2:26 PM

## 2013-03-01 DIAGNOSIS — R079 Chest pain, unspecified: Secondary | ICD-10-CM

## 2013-03-01 LAB — CBC
Hemoglobin: 14.8 g/dL (ref 13.0–17.0)
Platelets: 204 10*3/uL (ref 150–400)
WBC: 9.7 10*3/uL (ref 4.0–10.5)

## 2013-03-01 MED ORDER — ASPIRIN 81 MG PO CHEW: 81.0000 mg | CHEWABLE_TABLET | Freq: Every day | ORAL | Status: DC

## 2013-03-01 NOTE — Progress Notes (Signed)
Clinical Social Work Department BRIEF PSYCHOSOCIAL ASSESSMENT 03/01/2013  Patient:  Ivan Murphy, Ivan Murphy     Account Number:  [id number]     Admit date:  02/27/2013  Clinical Social Worker:  Kirke Shaggy  Date/Time:  02/28/2013 12:02 PM  Referred by:  RN  Date Referred:  02/28/2013 Referred for  Transportation assistance   Other Referral:   Interview type:  Patient Other interview type:    PSYCHOSOCIAL DATA Living Status:  OTHER Admitted from facility:  [facility] Level of care:  Group Home Primary support name:   Primary support relationship to patient:   Degree of support available:   The group home provides a lot of support.    CURRENT CONCERNS Current Concerns  None Noted   Other Concerns:    SOCIAL WORK ASSESSMENT / PLAN CSW met with the pt to set up transportation back to [facility]. Pt was in good spirits, alert and looking forward to being discharged from the hospital.   Assessment/plan status:  No Further Intervention Required Other assessment/ plan:   Information/referral to community resources:    PATIENT'S/FAMILY'S RESPONSE TO PLAN OF CARE: Pt is looking forward to going back to the Group Home. CSW called and spoke to a liason and they will pick up the pt when he is ready for d/c.  No other CSW needs.  CSW is signing off of this pt.   Sherald Barge, LCSW-A Clinical Social Worker [phone]

## 2013-03-01 NOTE — Progress Notes (Signed)
CSW attempted to meet with the pt yesterday at 4:05pm, pt wasn't in his room.  Will meet with the pt this morning.  Sherald Barge, LCSW-A Clinical Social Worker [phone]

## 2013-03-01 NOTE — Progress Notes (Signed)
Reviewed discharge instructions with patient and he stated his understanding.  Awaiting ride to return to [facility] house.  Notified Britney PA with [facility] to send prescriptions to [place] at [employer].  Colman Cater

## 2013-03-01 NOTE — Progress Notes (Signed)
[facility]  Subjective: No further chest pain. HA resolved. Pt is out of bed and in chair.   Objective: Vital signs in last 24 hours: Temp:  [98.5 F (36.9 C)-98.8 F (37.1 C)] 98.7 F (37.1 C) (06/05 0430) Pulse Rate:  [66-78] 78 (06/05 0430) Resp:  [18] 18 (06/05 0430) BP: (108-134)/(67-71) 112/71 mmHg (06/05 0430) SpO2:  [94 %-99 %] 94 % (06/05 0430) Last BM Date: 02/26/13  Intake/Output from previous day: 06/04 0701 - 06/05 0700 In: 900 [I.V.:900] Out: 2350 [Urine:2350] Intake/Output this shift: Total I/O In: 3 [I.V.:3] Out: 450 [Urine:450]  Medications Current Facility-Administered Medications  Medication Dose Route Frequency Provider Last Rate Last Dose  . acetaminophen (TYLENOL) tablet 650 mg  650 mg Oral Q4H PRN Runell Gess, MD   650 mg at 02/28/13 1716  . amoxicillin (AMOXIL) capsule 500 mg  500 mg Oral Q12H Neema Davina Poke, MD   500 mg at 03/01/13 1024  . aspirin chewable tablet 81 mg  81 mg Oral Daily Runell Gess, MD   81 mg at 03/01/13 1024  . atorvastatin (LIPITOR) tablet 80 mg  80 mg Oral q1800 Lars Mage, MD   80 mg at 03/01/13 1025  . menthol-cetylpyridinium (CEPACOL) lozenge 3 mg  1 lozenge Oral PRN Dow Adolph, MD      . morphine 2 MG/ML injection 1 mg  1 mg Intravenous Q1H PRN Runell Gess, MD      . nitroGLYCERIN (NITROSTAT) SL tablet 0.4 mg  0.4 mg Sublingual Q5 min PRN Charles B. Bernette Mayers, MD   0.4 mg at 02/27/13 0827  . nitroGLYCERIN (NITROSTAT) SL tablet 0.4 mg  0.4 mg Sublingual Q5 min PRN Lars Mage, MD      . ondansetron (ZOFRAN) tablet 4 mg  4 mg Oral Q6H PRN Leodis Sias, MD       Or  . ondansetron [facility]) injection 4 mg  4 mg Intravenous Q6H PRN Leodis Sias, MD      . sodium chloride 0.9 % injection 3 mL  3 mL Intravenous Q12H Leodis Sias, MD   3 mL at 03/01/13 1023    PE: General appearance: alert, cooperative and no distress Lungs: clear to auscultation  bilaterally Heart: regular rate and rhythm, S1, S2 normal, no murmur, click, rub or gallop Extremities: no LEE Pulses: 2+ and symmetric Skin: warm and dry Neurologic: Grossly normal  Lab Results:   Recent Labs  02/27/13 0641 02/28/13 0140 03/01/13 0615  WBC 11.0* 14.0* 9.7  HGB 15.3 14.0 14.8  HCT 42.5 39.9 41.9  PLT 186 188 204   BMET  Recent Labs  02/27/13 0641  NA 136  K 3.8  CL 100  CO2 25  GLUCOSE 109*  BUN 9  CREATININE 0.98  CALCIUM 9.4   PT/INR  Recent Labs  02/28/13 0935  LABPROT 13.6  INR 1.05   Cholesterol  Recent Labs  02/27/13 1254  CHOL 189   Cardiac Enzymes Cardiac Panel (last 3 results)  Recent Labs  02/27/13 1300 02/27/13 1618 02/27/13 2150  TROPONINI <0.30 <0.30 <0.30     Studies/Results:  LHC 02/28/13 HEMODYNAMICS:  AO SYSTOLIC/AO DIASTOLIC: 113/72  LV SYSTOLIC/LV DIASTOLIC: 113/6  ANGIOGRAPHIC RESULTS:  1. Left main; normal  2. LAD; normal  3. Left circumflex; normal.  4. Right coronary artery; dominant and normal  5. Left ventriculography; RAO left ventriculogram was performed using  25 mL of Visipaque dye at 12 mL/second. The overall LVEF estimated  60 %  Without wall motion abnormalities   Assessment/Plan  Principal Problem:   Chest pain, non cardiac, may be due to hypertension Active Problems:   Headache   Cough   Tobacco abuse   Hx of cocaine abuse   Acute streptococcal pharyngitis   S/P cardiac cath, 02/28/13, patent coronary arteries   Abnormal EKG, felt to be due to hypertension   Plan: Pt ruled out for MI. Diagnostic cath yesterday revealed normal coronaries and normal LV function. Pt was set to be discharged yesterday, however discharge was delayed due to need to find placement at halfway house. Social work has made arrangements. Pt is stable and denies further chest pain. Will discharge today and will have patient follow-up at [facility] with a MLP in 7-10 days.      LOS: 2 days    Brittainy M. Delmer Islam 03/01/2013 2:20 PM    Patient seen and examined. Agree with assessment and plan. No further chest pain. Stable cath site R radial. For dc today.   Lennette Bihari, MD, [facility] 03/01/2013 2:32 PM

## 2013-03-01 NOTE — Discharge Summary (Signed)
Physician Discharge Summary  Patient ID: Ivan Murphy MRN: [id number] DOB/AGE: [birth year] 52 y.o.  Admit date: 02/27/2013 Discharge date: 03/01/2013  Admission Diagnoses: Chest Pain  Discharge Diagnoses:  Principal Problem:   Chest pain, non cardiac, may be due to hypertension Active Problems:   Headache   Cough   Tobacco abuse   Hx of cocaine abuse   Acute streptococcal pharyngitis   S/P cardiac cath, 02/28/13, patent coronary arteries   Abnormal EKG, felt to be due to hypertension    Discharged Condition: stable  Hospital Course: The patient is a 52 year old African American male with a history of tobacco abuse since age 52 and past history of cocaine use, who was admitted on 02/27/13 with chest pain. He has no prior cardiac history and no family history of heart disease. At initial time of presentation, he had endorsed resting, substernal chest pain, described as "tight, squeezing pain" that awoke him from his sleep. He had associated nausea and diaphoresis. He had reported that the pain was exacerbated by movement and inspiration, however it was improved with sublingual nitroglycerine. His EKG demonstrated inferolateral T-wave inversions. His drug screen was negative. CXR was normal. He ultimately ruled out for MI with three sets of negative troponin's. However, a diagnostic left heart catheterization was recommended, to define his anatomy and to rule out an ischemic etiology. The procedure was performed by Dr. Allyson Sabal via the right radial artery. The cath revealed normal coronary arteries. The overall LVEF was estimated at 60%. There were no wall motion abnormalities. He left the cath lab in stable condition. There were no post-operative complications. The patient had no further recurrence of chest pain. He was last seen and examined by Dr. Tresa Endo, who determined he was stable for discharge home. He is scheduled to follow up at [facility], with Wilburt Finlay, PA-C on 03/12/13.  Consults:  None  Significant Diagnostic Studies:   LHC 02/28/13 HEMODYNAMICS:  AO SYSTOLIC/AO DIASTOLIC: 113/72  LV SYSTOLIC/LV DIASTOLIC: 113/6  ANGIOGRAPHIC RESULTS:  1. Left main; normal  2. LAD; normal  3. Left circumflex; normal.  4. Right coronary artery; dominant and normal  5. Left ventriculography; RAO left ventriculogram was performed using  25 mL of Visipaque dye at 12 mL/second. The overall LVEF estimated  60 % Without wall motion abnormalities   Treatments: See Hospital Course  Discharge Exam: Blood pressure 126/67, pulse 80, temperature 98.5 F (36.9 C), temperature source Oral, resp. rate 18, height 5\' 7"  (1.702 m), weight 179 lb (81.194 kg), SpO2 99.00%.   Disposition: 01-Home or Self Care  Discharge Orders   Future Appointments Provider Department Dept Phone   03/12/2013 8:00 AM Wilburt Finlay, PA-C [facility] [place] [phone]   Future Orders Complete By Expires     Diet - low sodium heart healthy  As directed     Increase activity slowly  As directed     Lifting restrictions  As directed     Comments:      No lifting more than 1/2 gallon of milk for 3 days        Medication List    TAKE these medications       amoxicillin 500 MG capsule  Commonly known as:  AMOXIL  Take 1 capsule (500 mg total) by mouth every 12 (twelve) hours.     aspirin 81 MG chewable tablet  Chew 1 tablet (81 mg total) by mouth daily.     atorvastatin 10 MG tablet  Commonly known as:  LIPITOR  Take 1 tablet (10 mg total) by mouth daily at 6 PM.           Follow-up Information   Follow up with HAGER, BRYAN, PA-C On 03/12/2013. (at 8:00AM)    Contact information:   [address] [place] [address] [phone]      TIME SPENT ON DISCHARGE, INCLUDING PHYSICIAN TIME: >30 MINUTES  Signed: Allayne Butcher, PA-C 03/03/2013, 4:53 PM

## 2013-03-01 NOTE — Progress Notes (Signed)
Clinical Social Work Department CLINICAL SOCIAL WORK PLACEMENT NOTE 03/01/2013  Patient:  Ivan Murphy, Ivan Murphy  Account Number:  [id number] Admit date:  02/27/2013  Clinical Social Worker:  Kirke Shaggy  Date/time:  02/28/2013 12:10 PM  Patient to be transferred to [facility] on  03/01/2013 Patient to be transferred to facility by [facility] transportation  Sherald Barge, LCSW-A Clinical Social Worker [phone]

## 2013-03-05 ENCOUNTER — Encounter (HOSPITAL_COMMUNITY): Payer: Self-pay | Admitting: *Deleted

## 2013-03-05 ENCOUNTER — Emergency Department (INDEPENDENT_AMBULATORY_CARE_PROVIDER_SITE_OTHER)
Admission: EM | Admit: 2013-03-05 | Discharge: 2013-03-05 | Disposition: A | Discharge status: Home / Self Care | Payer: Self-pay | Source: Home / Self Care | Attending: Emergency Medicine | Admitting: Emergency Medicine

## 2013-03-05 DIAGNOSIS — A630 Anogenital (venereal) warts: Secondary | ICD-10-CM

## 2013-03-05 DIAGNOSIS — J020 Streptococcal pharyngitis: Secondary | ICD-10-CM

## 2013-03-05 MED ORDER — AMOXICILLIN-POT CLAVULANATE 875-125 MG PO TABS: 1.0000 | ORAL_TABLET | Freq: Two times a day (BID) | ORAL | Status: DC

## 2013-03-05 MED ORDER — DEXAMETHASONE SODIUM PHOSPHATE 10 MG/ML IJ SOLN
INTRAMUSCULAR | Status: AC
  Filled 2013-03-05: qty 1

## 2013-03-05 MED ORDER — CEFTRIAXONE SODIUM 1 G IJ SOLR
1.0000 g | Freq: Once | INTRAMUSCULAR | Status: AC
  Administered 2013-03-05: 1 g via INTRAMUSCULAR

## 2013-03-05 MED ORDER — ACETAMINOPHEN 325 MG PO TABS
975.0000 mg | ORAL_TABLET | Freq: Once | ORAL | Status: AC
  Administered 2013-03-05: 975 mg via ORAL

## 2013-03-05 MED ORDER — DEXAMETHASONE SODIUM PHOSPHATE 10 MG/ML IJ SOLN
10.0000 mg | Freq: Once | INTRAMUSCULAR | Status: AC
  Administered 2013-03-05: 10 mg via INTRAMUSCULAR

## 2013-03-05 MED ORDER — CEFTRIAXONE SODIUM 1 G IJ SOLR
INTRAMUSCULAR | Status: AC
  Filled 2013-03-05: qty 10

## 2013-03-05 MED ORDER — ACETAMINOPHEN 325 MG PO TABS
ORAL_TABLET | ORAL | Status: AC
  Filled 2013-03-05: qty 3

## 2013-03-05 MED ORDER — LIDOCAINE HCL (PF) 1 % IJ SOLN
INTRAMUSCULAR | Status: AC
  Filled 2013-03-05: qty 5

## 2013-03-05 NOTE — ED Notes (Signed)
Throat Culture sent to lab with manual request.  

## 2013-03-05 NOTE — ED Provider Notes (Signed)
History     CSN: [id number]  Arrival date & time 03/05/13  [place]   First MD Initiated Contact with Patient 03/05/13 2008      Chief Complaint  Patient presents with  . Sore Throat  . Fever    (Consider location/radiation/quality/duration/timing/severity/associated sxs/prior treatment) HPI Comments: Pt presents c/o not feeling well and his sore throat is not getting better after 6 days on amoxicillin.  He was seen in the ED 6 days ago for chest pain and sore throat.  This led to a heart cath that was negative as well as a positive rapid strep test.  He was sent home with Dx of atypical chest pain and strep throat, put on amoxicillin 500 BID.  Since then, the throat pain has gotten slightly worse.  He has severe pain on the left side of his throat with swallowing.  He has been feeling fever and chills as well.  Denies any further chest pain, diaphoresis, NVD, dizziness, or headache.  He does not feel like his voice is affected   Patient is a 52 y.o. male presenting with pharyngitis and fever.  Sore Throat Pertinent negatives include no chest pain, no abdominal pain and no shortness of breath.  Fever Associated symptoms: sore throat   Associated symptoms: no chest pain, no chills, no cough, no diarrhea, no dysuria, no myalgias, no nausea, no rash and no vomiting     Past Medical History  Diagnosis Date  . Chest pain at rest 02/26/2013  . Headache(784.0)   . S/P cardiac cath, 02/28/13 02/28/2013  . Abnormal EKG, felt to be due to hypertension  02/28/2013    Past Surgical History  Procedure Laterality Date  . Irrigation and debridement abscess    . Cardiac catheterization  02/28/2013    Family History  Problem Relation Age of Onset  . Diabetes Brother     History  Substance Use Topics  . Smoking status: Former Smoker -- 1.00 packs/day for 39 years    Types: Cigarettes    Quit date: 02/12/2013  . Smokeless tobacco: Never Used  . Alcohol Use: No     Comment: history of cocaine and  marijuana.  clean since 02/12/13.        Review of Systems  Constitutional: Positive for fever. Negative for chills and fatigue.  HENT: Positive for sore throat. Negative for drooling, mouth sores, neck pain, neck stiffness and voice change.   Eyes: Negative for visual disturbance.  Respiratory: Negative for cough and shortness of breath.   Cardiovascular: Negative for chest pain, palpitations and leg swelling.  Gastrointestinal: Negative for nausea, vomiting, abdominal pain, diarrhea and constipation.  Genitourinary: Negative for dysuria, urgency, frequency and hematuria.  Musculoskeletal: Negative for myalgias and arthralgias.  Skin: Negative for rash.  Neurological: Negative for dizziness, weakness and light-headedness.    Allergies  Review of patient's allergies indicates no known allergies.  Home Medications   Current Outpatient Rx  Name  Route  Sig  Dispense  Refill  . amoxicillin (AMOXIL) 500 MG capsule   Oral   Take 1 capsule (500 mg total) by mouth every 12 (twelve) hours.   20 capsule   0   . aspirin 81 MG chewable tablet   Oral   Chew 1 tablet (81 mg total) by mouth daily.         Marland Kitchen atorvastatin (LIPITOR) 10 MG tablet   Oral   Take 1 tablet (10 mg total) by mouth daily at 6 PM.   30 tablet  5   . amoxicillin-clavulanate (AUGMENTIN) 875-125 MG per tablet   Oral   Take 1 tablet by mouth every 12 (twelve) hours.   14 tablet   0     BP 125/71  Pulse 85  Temp(Src) 102.8 F (39.3 C) (Oral)  Resp 20  SpO2 100%  Physical Exam  Nursing note and vitals reviewed. Constitutional: He is oriented to person, place, and time. He appears well-developed and well-nourished. No distress.  HENT:  Head: Normocephalic and atraumatic.  Mouth/Throat: Oropharyngeal exudate and posterior oropharyngeal erythema present. No posterior oropharyngeal edema or tonsillar abscesses.  Eyes: EOM are normal. Pupils are equal, round, and reactive to light.  Cardiovascular: Normal  rate and regular rhythm.  Exam reveals no gallop and no friction rub.   No murmur heard. Pulmonary/Chest: Effort normal and breath sounds normal. No respiratory distress. He has no wheezes. He has no rales.  Abdominal: Soft. There is no tenderness.  Lymphadenopathy:       Head (right side): No submandibular and no tonsillar adenopathy present.       Head (left side): No submandibular and no tonsillar adenopathy present.    He has no cervical adenopathy.  Neurological: He is oriented to person, place, and time.  Skin: Skin is warm and dry. No rash noted.  Psychiatric: He has a normal mood and affect. Judgment normal.    ED Course  Procedures (including critical care time)  Labs Reviewed - No data to display No results found.   1. Acute streptococcal pharyngitis       MDM  Pt is still febrile with no symptomatic improvement.  Will switch him to augmentin.  Will give IM ceftriaxone and decadron here.  Pt can use tylenol and gargle salt water as well.     Meds ordered this encounter  Medications  . acetaminophen (TYLENOL) tablet 975 mg    Sig:   . cefTRIAXone (ROCEPHIN) injection 1 g    Sig:   . dexamethasone (DECADRON) injection 10 mg    Sig:   . amoxicillin-clavulanate (AUGMENTIN) 875-125 MG per tablet    Sig: Take 1 tablet by mouth every 12 (twelve) hours.    Dispense:  14 tablet    Refill:  0           Graylon Good, PA-C 03/05/13 2114

## 2013-03-05 NOTE — ED Notes (Signed)
Stays at [facility].  C/o fever onset 6/3.  He was admitted for chest pain, throat pain and headache.  Did cardiac cath and it was neg.  He started on antibiotic Amoxicillin on 6/3.  States he went to work today at the [employer].  Has not noted any ticks on him.

## 2013-03-06 LAB — CULTURE, BLOOD (ROUTINE X 2)
Culture: NO GROWTH
Culture: NO GROWTH

## 2013-03-06 NOTE — ED Provider Notes (Signed)
Medical screening examination/treatment/procedure(s) were performed by non-physician practitioner and as supervising physician I was immediately available for consultation/collaboration.  Derriona Branscom   Rollie Hynek, MD 03/06/13 0841 

## 2013-03-08 ENCOUNTER — Encounter: Payer: Self-pay | Admitting: *Deleted

## 2013-03-08 LAB — CULTURE, GROUP A STREP

## 2013-03-12 ENCOUNTER — Ambulatory Visit: Payer: Self-pay | Admitting: Physician Assistant

## 2013-03-26 ENCOUNTER — Encounter (HOSPITAL_COMMUNITY): Payer: Self-pay | Admitting: *Deleted

## 2013-03-26 ENCOUNTER — Emergency Department (INDEPENDENT_AMBULATORY_CARE_PROVIDER_SITE_OTHER)
Admission: EM | Admit: 2013-03-26 | Discharge: 2013-03-26 | Disposition: A | Discharge status: Home / Self Care | Payer: Self-pay | Source: Home / Self Care | Attending: Emergency Medicine | Admitting: Emergency Medicine

## 2013-03-26 DIAGNOSIS — N4889 Other specified disorders of penis: Secondary | ICD-10-CM

## 2013-03-26 DIAGNOSIS — Z87438 Personal history of other diseases of male genital organs: Secondary | ICD-10-CM

## 2013-03-26 DIAGNOSIS — M545 Low back pain: Secondary | ICD-10-CM

## 2013-03-26 DIAGNOSIS — A630 Anogenital (venereal) warts: Secondary | ICD-10-CM

## 2013-03-26 MED ORDER — ACYCLOVIR 400 MG PO TABS: 400.0000 mg | ORAL_TABLET | Freq: Three times a day (TID) | ORAL | Status: DC

## 2013-03-26 MED ORDER — CEPHALEXIN 500 MG PO CAPS: 500.0000 mg | ORAL_CAPSULE | Freq: Four times a day (QID) | ORAL | Status: DC

## 2013-03-26 MED ORDER — BACITRACIN ZINC 500 UNIT/GM EX OINT: TOPICAL_OINTMENT | Freq: Two times a day (BID) | CUTANEOUS | Status: DC

## 2013-03-26 NOTE — ED Provider Notes (Signed)
History    CSN: [id number] Arrival date & time 03/26/13  1410  First MD Initiated Contact with Patient 03/26/13 1539     Chief Complaint  Patient presents with  . Sore   (Consider location/radiation/quality/duration/timing/severity/associated sxs/prior Treatment) The history is provided by the patient. No language interpreter was used.  PENILE LESIONS X SEVERAL DAYS NO PREVIOUS PENILE LESION IN THE PAST DENIES UNPROTECTED SEX Past Medical History  Diagnosis Date  . Chest pain at rest 02/26/2013  . Headache(784.0)   . S/P cardiac cath, 02/28/13 02/28/2013  . Abnormal EKG, felt to be due to hypertension  02/28/2013   Past Surgical History  Procedure Laterality Date  . Irrigation and debridement abscess    . Cardiac catheterization  02/28/2013   Family History  Problem Relation Age of Onset  . Diabetes Brother    History  Substance Use Topics  . Smoking status: Former Smoker -- 1.00 packs/day for 39 years    Types: Cigarettes    Quit date: 02/12/2013  . Smokeless tobacco: Never Used  . Alcohol Use: No     Comment: history of cocaine and marijuana.  clean since 02/12/13.      Review of Systems  Constitutional: Negative.   HENT: Negative.   Eyes: Negative.   Respiratory: Negative.   Cardiovascular: Negative.   Gastrointestinal: Negative.   Endocrine: Negative.   Genitourinary: Positive for genital sores and penile pain. Negative for discharge, penile swelling, scrotal swelling and testicular pain.  Musculoskeletal: Negative.   Neurological: Negative.   Hematological: Negative.   Psychiatric/Behavioral: Negative.   All other systems reviewed and are negative.    Allergies  Review of patient's allergies indicates no known allergies.  Home Medications   Current Outpatient Rx  Name  Route  Sig  Dispense  Refill  . acyclovir (ZOVIRAX) 400 MG tablet   Oral   Take 1 tablet (400 mg total) by mouth 3 (three) times daily.   30 tablet   0   . amoxicillin-clavulanate  (AUGMENTIN) 875-125 MG per tablet   Oral   Take 1 tablet by mouth every 12 (twelve) hours.   14 tablet   0   . aspirin 81 MG chewable tablet   Oral   Chew 1 tablet (81 mg total) by mouth daily.         Marland Kitchen atorvastatin (LIPITOR) 10 MG tablet   Oral   Take 1 tablet (10 mg total) by mouth daily at 6 PM.   30 tablet   5   . bacitracin ointment   Topical   Apply topically 2 (two) times daily.   120 g   0   . cephALEXin (KEFLEX) 500 MG capsule   Oral   Take 1 capsule (500 mg total) by mouth 4 (four) times daily.   28 capsule   0    BP 145/77  Pulse 64  Temp(Src) 98.5 F (36.9 C) (Oral)  Resp 16  SpO2 98% Physical Exam  Nursing note and vitals reviewed. Constitutional: He is oriented to person, place, and time. He appears well-developed and well-nourished.  HENT:  Head: Normocephalic and atraumatic.  Mouth/Throat: Oropharynx is clear and moist.  Eyes: Conjunctivae are normal. Pupils are equal, round, and reactive to light.  Neck: Normal range of motion. Neck supple.  Cardiovascular: Normal rate, regular rhythm, normal heart sounds and intact distal pulses.   No murmur heard. Pulmonary/Chest: Effort normal and breath sounds normal.  Abdominal: Soft. Bowel sounds are normal. He exhibits no distension  and no mass. There is no tenderness.  Genitourinary:  GENITAL EXAM=PAPULOMACULAR SORES PENILE  SHAFT SKIN AND HEAD; NO PENILE DISCHARGE; NO INGUINAL LYMPHADEOPATHY  Musculoskeletal: Normal range of motion.  Neurological: He is alert and oriented to person, place, and time. No cranial nerve deficit. He exhibits normal muscle tone. Coordination normal.  Skin: Skin is warm and dry.  Psychiatric: He has a normal mood and affect.    ED Course  Procedures (including critical care time) Labs Reviewed  HERPES SIMPLEX VIRUS CULTURE  RPR   No results found. 1. History of penile sores     MDM  PATIENT INFORMED LESIONS MAYBE HERPES GENITALIS;RPR ALSO DONE  WILL CALL WITH  RESULT IN 2 DAYS TAKE MEDICATIONS AS PRESCRIBED   Duwayne Heck de Marcello Moores, MD 03/26/13 [phone]

## 2013-03-26 NOTE — ED Notes (Signed)
Pt  Reports  He  Has  A   Sore on his  Penis       X  sev  Days    He  denys  Any  Discharge    He  Reports  It is  painfull  When it  Rubs  His  Pants      He  States  He  Applied   [employer] to the  Area      He  Reports  He  Is  At  [employer]  And  Is  Working in the  [employer]

## 2013-03-26 NOTE — ED Notes (Signed)
Pt  Has   An  inflammed     Irritated   Area     On  His  Penis

## 2013-03-28 LAB — HERPES SIMPLEX VIRUS CULTURE

## 2013-04-17 ENCOUNTER — Observation Stay (HOSPITAL_COMMUNITY): Payer: Self-pay

## 2013-04-17 ENCOUNTER — Observation Stay (HOSPITAL_COMMUNITY)
Admission: EM | Admit: 2013-04-17 | Discharge: 2013-04-19 | Disposition: A | Discharge status: Home / Self Care | Payer: MEDICAID | Attending: Internal Medicine | Admitting: Internal Medicine

## 2013-04-17 ENCOUNTER — Emergency Department (HOSPITAL_COMMUNITY): Payer: Self-pay

## 2013-04-17 ENCOUNTER — Encounter (HOSPITAL_COMMUNITY): Payer: Self-pay | Admitting: *Deleted

## 2013-04-17 DIAGNOSIS — R509 Fever, unspecified: Secondary | ICD-10-CM | POA: Diagnosis present

## 2013-04-17 DIAGNOSIS — L02413 Cutaneous abscess of right upper limb: Secondary | ICD-10-CM

## 2013-04-17 DIAGNOSIS — Z720 Tobacco use: Secondary | ICD-10-CM | POA: Diagnosis present

## 2013-04-17 DIAGNOSIS — E785 Hyperlipidemia, unspecified: Secondary | ICD-10-CM | POA: Insufficient documentation

## 2013-04-17 DIAGNOSIS — W57XXXA Bitten or stung by nonvenomous insect and other nonvenomous arthropods, initial encounter: Secondary | ICD-10-CM | POA: Insufficient documentation

## 2013-04-17 DIAGNOSIS — IMO0002 Reserved for concepts with insufficient information to code with codable children: Secondary | ICD-10-CM | POA: Insufficient documentation

## 2013-04-17 DIAGNOSIS — R519 Headache, unspecified: Secondary | ICD-10-CM | POA: Diagnosis present

## 2013-04-17 DIAGNOSIS — R42 Dizziness and giddiness: Principal | ICD-10-CM | POA: Diagnosis present

## 2013-04-17 DIAGNOSIS — L089 Local infection of the skin and subcutaneous tissue, unspecified: Secondary | ICD-10-CM | POA: Insufficient documentation

## 2013-04-17 DIAGNOSIS — R51 Headache: Secondary | ICD-10-CM | POA: Insufficient documentation

## 2013-04-17 HISTORY — DX: Essential (primary) hypertension: I10

## 2013-04-17 LAB — CBC WITH DIFFERENTIAL/PLATELET
Eosinophils Absolute: 0.1 10*3/uL (ref 0.0–0.7)
HCT: 38.4 % — ABNORMAL LOW (ref 39.0–52.0)
Hemoglobin: 13.5 g/dL (ref 13.0–17.0)
Lymphs Abs: 1 10*3/uL (ref 0.7–4.0)
MCH: 27.8 pg (ref 26.0–34.0)
MCV: 79.2 fL (ref 78.0–100.0)
Monocytes Relative: 13 % — ABNORMAL HIGH (ref 3–12)
Neutrophils Relative %: 72 % (ref 43–77)
RBC: 4.85 MIL/uL (ref 4.22–5.81)

## 2013-04-17 LAB — TROPONIN I: Troponin I: <0.3 ng/mL (ref <0.30)

## 2013-04-17 LAB — BASIC METABOLIC PANEL
BUN: 10 mg/dL (ref 6–23)
GFR calc non Af Amer: >90 mL/min (ref >90)
Glucose, Bld: 107 mg/dL — ABNORMAL HIGH (ref 70–99)
Potassium: 4.2 mEq/L (ref 3.5–5.1)

## 2013-04-17 MED ORDER — ONDANSETRON HCL 4 MG PO TABS
4.0000 mg | ORAL_TABLET | Freq: Four times a day (QID) | ORAL | Status: DC | PRN
  Administered 2013-04-17: 4 mg via ORAL
  Filled 2013-04-17: qty 1

## 2013-04-17 MED ORDER — MECLIZINE HCL 25 MG PO TABS
25.0000 mg | ORAL_TABLET | Freq: Once | ORAL | Status: AC
  Administered 2013-04-17: 25 mg via ORAL
  Filled 2013-04-17: qty 1

## 2013-04-17 MED ORDER — ASPIRIN EC 81 MG PO TBEC
81.0000 mg | DELAYED_RELEASE_TABLET | Freq: Every day | ORAL | Status: DC
  Administered 2013-04-17 – 2013-04-19 (×3): 81 mg via ORAL
  Filled 2013-04-17 (×3): qty 1

## 2013-04-17 MED ORDER — LORAZEPAM 1 MG PO TABS
1.0000 mg | ORAL_TABLET | Freq: Once | ORAL | Status: AC
  Administered 2013-04-17: 1 mg via ORAL
  Filled 2013-04-17: qty 1

## 2013-04-17 MED ORDER — ACETAMINOPHEN 325 MG PO TABS
650.0000 mg | ORAL_TABLET | Freq: Four times a day (QID) | ORAL | Status: DC | PRN
  Administered 2013-04-17 – 2013-04-18 (×2): 650 mg via ORAL
  Filled 2013-04-17 (×2): qty 2

## 2013-04-17 MED ORDER — SODIUM CHLORIDE 0.9 % IJ SOLN
3.0000 mL | Freq: Two times a day (BID) | INTRAMUSCULAR | Status: DC
  Administered 2013-04-17 – 2013-04-18 (×2): 3 mL via INTRAVENOUS

## 2013-04-17 MED ORDER — ACETAMINOPHEN 650 MG RE SUPP: 650.0000 mg | Freq: Four times a day (QID) | RECTAL | Status: DC | PRN

## 2013-04-17 MED ORDER — ENOXAPARIN SODIUM 40 MG/0.4ML ~~LOC~~ SOLN
40.0000 mg | SUBCUTANEOUS | Status: DC
  Administered 2013-04-17 – 2013-04-18 (×2): 40 mg via SUBCUTANEOUS
  Filled 2013-04-17 (×3): qty 0.4

## 2013-04-17 MED ORDER — ONDANSETRON HCL 4 MG/2ML IJ SOLN: 4.0000 mg | Freq: Four times a day (QID) | INTRAMUSCULAR | Status: DC | PRN

## 2013-04-17 MED ORDER — GADOBENATE DIMEGLUMINE 529 MG/ML IV SOLN
17.0000 mL | Freq: Once | INTRAVENOUS | Status: AC
  Administered 2013-04-17: 17 mL via INTRAVENOUS
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
## 2013-04-17 NOTE — Progress Notes (Signed)
Felicia, liaison with [facility] in to see patient. Appointment made for patient on Aug 6,2014 at 1100 for Family Medicine at [facility]. Importance of keeping appointment expressed to patient. Asked to notify in advance if need to reschedule. All pertinent paperwork given to patient.

## 2013-04-17 NOTE — H&P (Signed)
Date: 04/17/2013               Patient Name:  Ivan Murphy MRN: [id number]  DOB: Apr 02, 1961 Age / Sex: 52 y.o., male   PCP: No Pcp Per Patient              Medical Service: Internal Medicine Teaching Service              Attending Physician: Dr. Josem Kaufmann    First Contact: Dr. Aundria Rud Pager: [phone]  Second Contact: Dr.Brown Pager: [phone]            After Hours (After 5p/  First Contact Pager: [phone]  weekends / holidays): Second Contact Pager: [phone]   Chief Complaint:   Dizziness   History of Present Illness:  Ivan Murphy is a 52 year old AA male with PMH of  HTN and hyperlipidemia, who presents with dizziness. He reports acute onset of dizziness upon standing, after getting up to go to the bathroom multiple times last night. He reports continued intermittent episodes of positional dizziness today with no other clear precipitants. He describes the dizziness as a lightheadedness or "swimming" sesation and endorses feeling hot and diaphoretic with onset, but denies loss of consciousness at any point. Rapid changes in head or body position worsen the symptoms, while sitting or laying down relieves the symptoms. Additionally, he reports accompanying headache and generalized weakness but denies any focal numbness, tingling or weakness. He denies any changes in vision, hearing or speech with onset. He denies any chest pain, shortness of breath, nausea or vomtiting. He denies any fever, chills, or sick contacts.    Neurology saw the patient in the ED and initiated a stroke workup. MRI revealed questionable right lateral medullary infarct and abnormal right vertebral artery. MRA head and neck were stable and negative for stenosis. 81mg  aspirin was started in ED.   Meds: Current Facility-Administered Medications  Medication Dose Route Frequency Provider Last Rate Last Dose  . aspirin EC tablet 81 mg  81 mg Oral Daily Wyatt Portela, MD       Current Outpatient Prescriptions  Medication Sig  Dispense Refill  . acyclovir (ZOVIRAX) 400 MG tablet Take 1 tablet (400 mg total) by mouth 3 (three) times daily.  30 tablet  0  . aspirin 81 MG chewable tablet Chew 1 tablet (81 mg total) by mouth daily.      . bacitracin ointment Apply topically 2 (two) times daily.  120 g  0    Allergies: Allergies as of 04/17/2013  . (No Known Allergies)   Past Medical History  Diagnosis Date  . Chest pain at rest 02/26/2013  . Headache(784.0)   . S/P cardiac cath, 02/28/13 02/28/2013  . Abnormal EKG, felt to be due to hypertension  02/28/2013  . Hypertension    Past Surgical History  Procedure Laterality Date  . Irrigation and debridement abscess    . Cardiac catheterization  02/28/2013   Family History  Problem Relation Age of Onset  . Diabetes Brother    History   Social History  . Marital Status: Divorced    Spouse Name: N/A    Number of Children: N/A  . Years of Education: N/A   Occupational History  . Not on file.   Social History Main Topics  . Smoking status: Former Smoker -- 1.00 packs/day for 39 years    Types: Cigarettes    Quit date: 02/12/2013  . Smokeless tobacco: Never Used  . Alcohol Use: No  Comment: history of cocaine and marijuana.  clean since 02/12/13.    . Drug Use: No  . Sexually Active: Not Currently   Other Topics Concern  . Not on file   Social History Narrative   Divorced.  One daughter 60 years old.  2 grandchildren.  Father deceased and mother living in group home in [place].  Currently living at the [facility] house.  Former cocaine, marijuana, and tobacco user clean since 02/12/13 when he moved into the [facility]  Works with the [employer].    Review of Systems: As per HPI in addition to  General: position dizziness with associated diaphoresis, no fever, chills, night sweats, weight change Head: bilateral frontal headache X1d, no trauma Eyes: no changes in vision, no blurriness, photosensitivity, or acute visual loss Ears: no changes in  hearing, no tinnitus, unclear if vertigo is present  Nose, sinuses: No rhinorrhea, no sneezing, allergies Cardiac: no angina, palpitations, dyspnea on exertion, or PND Respiratory: no shortness of breath, no wheezing, or coughing GI: No nausea, vomiting or change in bowel habits   Physical Exam: Blood pressure 122/82, pulse 85, temperature 98.7 F (37.1 C), temperature source Oral, resp. rate 18, height 5\' 5"  (1.651 m), weight 84.369 kg (186 lb), SpO2 96.00%.  General: was asleep upon entering the room, well appearing male, alert and engaged and in no acute distress  HEENT: pupils equal, round and reactive to light, no scleral icterus or conjunctiva, moist mucous membranes Heart: regular rate and rhythm, no gallops or rubs appreciated Lungs: good respiratory effort, lungs clear to ascultation bilaterally,no wheezes, rales or rhonchi  Abdomen: soft, non-tender abdomen, normal active bowel sounds, no rebound or guarding  Extremities: no peripheral edema bilaterally Neuro: alert and oriented x3, cranial nerves II-XII intact, strength and sensation intact in upper and lower extremities, positive dicks hallpike with reproducible symptoms of dizziness, no nystagmus  Lab results: @LABTEST @  Imaging results:  Mr Brain Wo Contrast  04/17/2013   *RADIOLOGY REPORT*  Clinical Data: Bug bite.  Dizziness when turning head fast. Hypertension.  MRI HEAD WITHOUT CONTRAST  Technique:  Multiplanar, multiecho pulse sequences of the brain and surrounding structures were obtained according to standard protocol without intravenous contrast.  Comparison: 09/14/2007.  Findings: Motion degraded exam.  Minimal increased signal right lateral medulla (series 4 image 7), this may be related to artifact however, given the patient's history of dizziness and high blood pressure, a tiny infarct is difficult to completely excluded.  Otherwise no evidence of acute infarct.  Abnormal appearance of the right vertebral artery  which appears congenitally small however superimposed narrowing or occlusion is suspected.  No intracranial hemorrhage.  No hydrocephalus.  No intracranial mass lesion detected on this unenhanced exam.  Minimal nonspecific white matter hyperintensity anterior left periopercular region may be related to small vessel disease or migraine headaches.  Mild spinal stenosis C3-4.  Cervical medullary junction, pituitary region, pineal region and orbital structures unremarkable.  Minimal paranasal sinus mucosal thickening.  IMPRESSION: Motion degraded exam.  Minimal increased signal right lateral medulla (series 4 image 7), this may be related to artifact however, given the patient's history of dizziness and high blood pressure, a tiny infarct is difficult to excluded. Additionally, there is an abnormal appearance of the right vertebral artery which although congenitally small also appears narrowed or occluded.   Original Report Authenticated By: Lacy Duverney, M.D.    Other results: EKG: normal EKG, normal sinus rhythm.  Assessment & Plan by Problem:  Ivan Murphy is  a 52 year old AA male with PMH of HTN and hyperlipidemia, who presents with acute onset of positional dizziness with headache and generalized weakness for 1 day.    #Dizziness Patient presents with acute onset of dizziness, generalized weakness, and associated diaphoresis that occur in response to postural change most consistent with diagnosis of orthostatic hypotension. However the patient lacks obvious cause for orthostasis such as dehydration, anemia, heart disease, or medication side effect which makes this diagnosis less likely. Additionally, due to his vascular risk factors, HTN and hyperlipidemia, ischemic stroke is also on the differential. MRI indicated questionable infarct in the right lateral medulla, otherwise no acute process was noted, as well as abnormal appearance of the right vertebral artery with suspected superimposed narrowing.  Carotid doppler  MRA head and neck were stable and negative for stenosis. Although MRI findings are suspicious for ischemic stroke, negative MRA of the head and neck make this a less likely cause as there is no clear source of ischemia.  Additionally the location of suspicious infarct would be consistent with lateral medullar syndrome, or Wallenberg syndrome. Although this syndrome commonly presents with positional dizziness, we would also expect a constellation of other vestibulocerebellar symptoms, such as ipsilateral hypotonia and ataxia, blurred vision, nystagmus to be present. Additionally, sensory signs and symptoms, notably ipsilateral loss of facial pain and temperature are common but absent in this patient further decreasing the likelihood of ischemic stroke. Based on the waxing and waning, recurrent, positional nature of the dizziness, and its reproducibility with dix-hallpike exam in the absence of other neurological complaints, benign paroxysmal positional vertigo cannot be ruled out. However, absence of nystagmus makes this diagnosis less likely. To differentiate between diagnoses, the plan is as follows - Obtain orthostatic blood pressure - Start on 81mg  aspirin  - Potentially discharge in the morning  #HTN History of HTN documented, but not medically controlled. In the ED patient maintained normotensive to mild hypertensive systolic pressures in the range of 122/82- 144/76.  -Holding medication for now in the context of stroke workup   This is a Psychologist, occupational Note.  The care of the patient was discussed with Dr. Aundria Rud and the assessment and plan was formulated with their assistance.  Please see their note for official documentation of the patient encounter.   Signed: Everardo All, Med Student 04/17/2013, 2:50 PM

## 2013-04-17 NOTE — ED Notes (Signed)
Pt requesting to eat, meal tray ordered

## 2013-04-17 NOTE — ED Notes (Signed)
Patient presents with c/o of bug bite to right lower forearm.  Noticed some yellowish drainage.  Also c/o feeling dizzy when he moves his head fast.  Stated this started yesterday.

## 2013-04-17 NOTE — ED Notes (Signed)
Pt in MRI at the time. 

## 2013-04-17 NOTE — ED Provider Notes (Signed)
Received pt in handover from overnight EDP, Dr Preston Fleeting, pending MRI brain. Abnormal findings of R vertebral artery. Discussed with Dr Cyril Mourning. Recommended MRA head and neck. Pt c/o lightheadedness when turning head to left or right. No nystagmus, bl finger to nose intact, CN II-XII intact, 5/5 motor in all ext, sensation intact.  Dr Cyril Mourning to see in ED. Advises admission for futher workup   Date: 04/17/2013  Rate: 90  Rhythm: normal sinus rhythm  QRS Axis: normal  Intervals: normal  ST/T Wave abnormalities: nonspecific T wave changes  Conduction Disutrbances:none  Narrative Interpretation:   Old EKG Reviewed: changes noted    Loren Racer, MD 04/19/13 [phone]

## 2013-04-17 NOTE — ED Provider Notes (Signed)
History    CSN: [id number] Arrival date & time 04/17/13  0456  First MD Initiated Contact with Patient 04/17/13 [phone]     Chief Complaint  Patient presents with  . Insect Bite   (Consider location/radiation/quality/duration/timing/severity/associated sxs/prior Treatment) The history is provided by the patient.   52 year old male noticed an insect bite on his right forearm about 3 days ago which has been swelling and had some yellowish eye drainage. It is moderately painful. He denies fever, chills, sweats. Also, tonight at about 11 PM, he noticed that his head felt swimmy especially when he had moved his head. There is no associated nausea. 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 He denies any your pain and tinnitus. He denies any difficulty with walking. He denies any headache. He has not had similar symptoms before. Past Medical History  Diagnosis Date  . Chest pain at rest 02/26/2013  . Headache(784.0)   . S/P cardiac cath, 02/28/13 02/28/2013  . Abnormal EKG, felt to be due to hypertension  02/28/2013  . Hypertension    Past Surgical History  Procedure Laterality Date  . Irrigation and debridement abscess    . Cardiac catheterization  02/28/2013   Family History  Problem Relation Age of Onset  . Diabetes Brother    History  Substance Use Topics  . Smoking status: Former Smoker -- 1.00 packs/day for 39 years    Types: Cigarettes    Quit date: 02/12/2013  . Smokeless tobacco: Never Used  . Alcohol Use: No     Comment: history of cocaine and marijuana.  clean since 02/12/13.      Review of Systems  All other systems reviewed and are negative.    Allergies  Review of patient's allergies indicates no known allergies.  Home Medications   Current Outpatient Rx  Name  Route  Sig  Dispense  Refill  . acyclovir (ZOVIRAX) 400 MG tablet   Oral   Take 1 tablet (400 mg total) by mouth 3 (three) times daily.   30 tablet   0   . amoxicillin-clavulanate  (AUGMENTIN) 875-125 MG per tablet   Oral   Take 1 tablet by mouth every 12 (twelve) hours.   14 tablet   0   . aspirin 81 MG chewable tablet   Oral   Chew 1 tablet (81 mg total) by mouth daily.         Marland Kitchen atorvastatin (LIPITOR) 10 MG tablet   Oral   Take 1 tablet (10 mg total) by mouth daily at 6 PM.   30 tablet   5   . bacitracin ointment   Topical   Apply topically 2 (two) times daily.   120 g   0   . cephALEXin (KEFLEX) 500 MG capsule   Oral   Take 1 capsule (500 mg total) by mouth 4 (four) times daily.   28 capsule   0    BP 142/84  Pulse 75  Temp(Src) 98.8 F (37.1 C) (Oral)  Resp 18  Ht 5\' 5"  (1.651 m)  Wt 186 lb (84.369 kg)  BMI 30.95 kg/m2  SpO2 98% Physical Exam  Nursing note and vitals reviewed.  52 year old male, resting comfortably and in no acute distress. Vital signs are significant for borderline hypertension with blood pressure 142/84. Oxygen saturation is 98%, which is normal. Head is normocephalic and atraumatic. PERRLA, EOMI. Oropharynx is clear. There is no nystagmus. TMs are clear. Neck is nontender and supple  without adenopathy or JVD. Back is nontender and there is no CVA tenderness. Lungs are clear without rales, wheezes, or rhonchi. Chest is nontender. Heart has regular rate and rhythm without murmur. Abdomen is soft, flat, nontender without masses or hepatosplenomegaly and peristalsis is normoactive. Extremities have no cyanosis or edema, full range of motion is present. Raised area is present in the right forearm dorsal surface with central umbilication. This is moderately tender. There is no erythema the skin and no warmth. Skin is warm and dry without rash. Neurologic: Mental status is normal, cranial nerves are intact, there are no motor or sensory deficits.  ED Course  Procedures (including critical care time) INCISION AND DRAINAGE Performed by: [email or web address],[email or web address] Consent: Verbal consent obtained. Risks and benefits: risks, benefits  and alternatives were discussed Type: abscess  Body area: Right forearm  Anesthesia: local infiltration  Incision was made with a scalpel.  Local anesthetic: lidocaine 2% without epinephrine  Anesthetic total: 3 ml  Complexity: complex Blunt dissection to break up loculations  Drainage: purulent  Drainage amount: Small to moderate   Packing material: None   Patient tolerance: Patient tolerated the procedure well with no immediate complications.  Results for orders placed during the hospital encounter of 04/17/13  CBC WITH DIFFERENTIAL      Result Value Range   WBC 7.2  4.0 - 10.5 K/uL   RBC 4.85  4.22 - 5.81 MIL/uL   Hemoglobin 13.5  13.0 - 17.0 g/dL   HCT 82.9 (*) 56.2 - 13.0 %   MCV 79.2  78.0 - 100.0 fL   MCH 27.8  26.0 - 34.0 pg   MCHC 35.2  30.0 - 36.0 g/dL   RDW 86.5  78.4 - 69.6 %   Platelets 182  150 - 400 K/uL   Neutrophils Relative % 72  43 - 77 %   Neutro Abs 5.2  1.7 - 7.7 K/uL   Lymphocytes Relative 13  12 - 46 %   Lymphs Abs 1.0  0.7 - 4.0 K/uL   Monocytes Relative 13 (*) 3 - 12 %   Monocytes Absolute 1.0  0.1 - 1.0 K/uL   Eosinophils Relative 1  0 - 5 %   Eosinophils Absolute 0.1  0.0 - 0.7 K/uL   Basophils Relative 0  0 - 1 %   Basophils Absolute 0.0  0.0 - 0.1 K/uL  BASIC METABOLIC PANEL      Result Value Range   Sodium 133 (*) 135 - 145 mEq/L   Potassium 4.2  3.5 - 5.1 mEq/L   Chloride 99  96 - 112 mEq/L   CO2 23  19 - 32 mEq/L   Glucose, Bld 107 (*) 70 - 99 mg/dL   BUN 10  6 - 23 mg/dL   Creatinine, Ser 2.95  0.50 - 1.35 mg/dL   Calcium 9.1  8.4 - 28.4 mg/dL   GFR calc non Af Amer >90  >90 mL/min   GFR calc Af Amer >90  >90 mL/min  TROPONIN I      Result Value Range   Troponin I <0.30  <0.30 ng/mL   Mr Shirlee Latch Wo Contrast  04/17/2013   *RADIOLOGY REPORT*  Clinical Data:  52 year old male with vertigo, weakness, headache.  Contrast: 17mL MULTIHANCE GADOBENATE DIMEGLUMINE 529 MG/ML IV SOLN  Comparison: Brain MRI 04/17/2013. [facility] Head and neck MRA 09/14/2007.  MRA NECK WITHOUT AND WITH CONTRAST  Technique:  Angiographic images of the neck were obtained using MRA technique without  and with intravenous contrast.  Carotid stenosis measurements (when applicable) are obtained utilizing NASCET criteria, using the distal internal carotid diameter as the denominator.  Findings:  Precontrast time-of-flight imaging.  Antegrade flow in both carotid and vertebral arteries in the neck; the right vertebral artery is diminutive as before.  Stable time-of-flight appearance of the carotid bifurcations.  Postcontrast images with a bovine arch configuration re-identified. Stable and negative great vessel origins.  Stable and negative right common carotid artery, right carotid bifurcation, and cervical right ICA.  Stable and negative left CCA, left carotid bifurcation, and cervical left ICA.  Both vertebral artery origins are within normal limits, the left vertebral artery is dominant.  No vertebral artery stenosis in the neck.  Intracranial findings are below.  IMPRESSION: 1.  Stable and negative neck MRA.  Dominant left vertebral artery and bovine arch configuration incidentally noted. 2.  Intracranial findings are below.  MRA HEAD WITHOUT CONTRAST  Technique: Angiographic images of the Circle of Willis were obtained using MRA technique without  intravenous contrast.  Findings:  Dominant distal left vertebral artery is antegrade flow as before.  Less antegrade flow signal in the nondominant right vertebral artery, which appears to functionally terminating PICA as before.  Still, the contrast MRA appearance of the vessel was stable (above).  Patent basilar artery and AICA origins.  No basilar stenosis.  SCA and PCA origins are stable within normal limits.  Posterior communicating arteries are diminutive or absent.  Bilateral PCA branches are stable and within normal limits.  Antegrade flow in both ICA siphons.  No ICA stenosis.  Stable carotid  termini, the right may be fenestrated.  Ophthalmic artery origins are within normal limits.  Stable MCA and ACA origins, the left ACA A1 segment is dominant.  The anterior communicating artery appears fenestrated.  ACA branches are stable and within normal limits allowing for some pulsation artifact.  Normal MCA origins. Visualized bilateral MCA branchesare stable and within normal limits.  IMPRESSION: Stable and negative intracranial MRA compared to 09/14/2007.   Original Report Authenticated By: Erskine Speed, M.D.   Mr Angiogram Neck W Wo Contrast  04/17/2013   *RADIOLOGY REPORT*  Clinical Data:  52 year old male with vertigo, weakness, headache.  Contrast: 17mL MULTIHANCE GADOBENATE DIMEGLUMINE 529 MG/ML IV SOLN  Comparison: Brain MRI 04/17/2013. [facility] Head and neck MRA 09/14/2007.  MRA NECK WITHOUT AND WITH CONTRAST  Technique:  Angiographic images of the neck were obtained using MRA technique without and with intravenous contrast.  Carotid stenosis measurements (when applicable) are obtained utilizing NASCET criteria, using the distal internal carotid diameter as the denominator.  Findings:  Precontrast time-of-flight imaging.  Antegrade flow in both carotid and vertebral arteries in the neck; the right vertebral artery is diminutive as before.  Stable time-of-flight appearance of the carotid bifurcations.  Postcontrast images with a bovine arch configuration re-identified. Stable and negative great vessel origins.  Stable and negative right common carotid artery, right carotid bifurcation, and cervical right ICA.  Stable and negative left CCA, left carotid bifurcation, and cervical left ICA.  Both vertebral artery origins are within normal limits, the left vertebral artery is dominant.  No vertebral artery stenosis in the neck.  Intracranial findings are below.  IMPRESSION: 1.  Stable and negative neck MRA.  Dominant left vertebral artery and bovine arch configuration incidentally noted. 2.   Intracranial findings are below.  MRA HEAD WITHOUT CONTRAST  Technique: Angiographic images of the Circle of Willis were obtained using MRA technique  without  intravenous contrast.  Findings:  Dominant distal left vertebral artery is antegrade flow as before.  Less antegrade flow signal in the nondominant right vertebral artery, which appears to functionally terminating PICA as before.  Still, the contrast MRA appearance of the vessel was stable (above).  Patent basilar artery and AICA origins.  No basilar stenosis.  SCA and PCA origins are stable within normal limits.  Posterior communicating arteries are diminutive or absent.  Bilateral PCA branches are stable and within normal limits.  Antegrade flow in both ICA siphons.  No ICA stenosis.  Stable carotid termini, the right may be fenestrated.  Ophthalmic artery origins are within normal limits.  Stable MCA and ACA origins, the left ACA A1 segment is dominant.  The anterior communicating artery appears fenestrated.  ACA branches are stable and within normal limits allowing for some pulsation artifact.  Normal MCA origins. Visualized bilateral MCA branchesare stable and within normal limits.  IMPRESSION: Stable and negative intracranial MRA compared to 09/14/2007.   Original Report Authenticated By: Erskine Speed, M.D.   Mr Brain Wo Contrast  04/17/2013   *RADIOLOGY REPORT*  Clinical Data: Bug bite.  Dizziness when turning head fast. Hypertension.  MRI HEAD WITHOUT CONTRAST  Technique:  Multiplanar, multiecho pulse sequences of the brain and surrounding structures were obtained according to standard protocol without intravenous contrast.  Comparison: 09/14/2007.  Findings: Motion degraded exam.  Minimal increased signal right lateral medulla (series 4 image 7), this may be related to artifact however, given the patient's history of dizziness and high blood pressure, a tiny infarct is difficult to completely excluded.  Otherwise no evidence of acute infarct.   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  No intracranial hemorrhage.  No hydrocephalus.  No intracranial mass lesion detected on this unenhanced exam.  Minimal nonspecific white matter hyperintensity anterior left periopercular region may be related to small vessel disease or migraine headaches.  Mild spinal stenosis C3-4.  Cervical medullary junction, pituitary region, pineal region and orbital structures unremarkable.  Minimal paranasal sinus mucosal thickening.  IMPRESSION: Motion degraded exam.  Minimal increased signal right lateral medulla (series 4 image 7), this may be related to artifact however, given the patient's history of dizziness and high blood pressure, a tiny infarct is difficult to excluded. Additionally, there is an abnormal appearance of the right vertebral artery which although congenitally small also appears narrowed or occluded.   Original Report Authenticated By: Lacy Duverney, M.D.       1. Vertigo   2. Abscess of right forearm     MDM  Vertigo which seems to be peripheral vertigo. He'll be given a therapeutic trial of meclizine. Lesion of the right forearm which is suspicious for abscess. Limited bedside ultrasound was done to evaluate whether there was an abscess present. Images have been saved. There is a definite abscess cavity present. Incision and drainage was done yielding a small to moderate amount of pus. Because there was no overlying cellulitis, antibiotics are not prescribed.  No improvement in vertigo with mecllzine. Will oreder MRI. Case signed out to Dr. Ranae Palms.  Dione Booze, MD 04/17/13 2258

## 2013-04-17 NOTE — ED Notes (Signed)
Pt transported to vascular for doppler. Will go upstairs after.

## 2013-04-17 NOTE — Progress Notes (Signed)
VASCULAR LAB PRELIMINARY  PRELIMINARY  PRELIMINARY  PRELIMINARY  Carotid duplex completed.    Preliminary report:  Bilateral:  Less than 39% ICA stenosis.  Vertebral artery flow is antegrade.     Ivan Murphy, RVS 04/17/2013, 4:41 PM

## 2013-04-17 NOTE — Consult Note (Signed)
NEURO HOSPITALIST CONSULT NOTE    Reason for Consult: vertigo and generalized weakness.  HPI:                                                                                                                                          Ivan Murphy is an 52 y.o. male with a past medical history significant for HTN, s/p cardiac cath 6/14, comes in with complaining of acute onset vertigo, generalized weakness, and headache. State that never had vertigo before, but since last night has been experiencing " a sever spinning sensation" that is worsened by head movements but no associated with nausea, vomiting, double vision, dysequilibrium, focal weakness or numbness, slurred speech, language or vision impairment. No recent fever, infection, head or neck trauma. No tinnitus or hearing impairment. CT brain was unremarkable. MRI-DWI showed minimal increased signal right lateral medulla, ? Artifact versus acute ischemia.    Past Medical History  Diagnosis Date  . Chest pain at rest 02/26/2013  . Headache(784.0)   . S/P cardiac cath, 02/28/13 02/28/2013  . Abnormal EKG, felt to be due to hypertension  02/28/2013  . Hypertension     Past Surgical History  Procedure Laterality Date  . Irrigation and debridement abscess    . Cardiac catheterization  02/28/2013    Family History  Problem Relation Age of Onset  . Diabetes Brother     Family History:   Social History:  reports that he quit smoking about 2 months ago. His smoking use included Cigarettes. He has a 39 pack-year smoking history. He has never used smokeless tobacco. He reports that he does not drink alcohol or use illicit drugs.  No Known Allergies  MEDICATIONS:                                                                                                                     I have reviewed the patient's current medications.   ROS:  History obtained from the patient and chart review.  General ROS: negative for - chills, fatigue, fever, night sweats, weight gain or weight loss Psychological ROS: negative for - behavioral disorder, hallucinations, memory difficulties, mood swings or suicidal ideation Ophthalmic ROS: negative for - blurry vision, double vision, eye pain or loss of vision ENT ROS: negative for - epistaxis, nasal discharge, oral lesions, sore throat, tinnitus. Allergy and Immunology ROS: negative for - hives or itchy/watery eyes Hematological and Lymphatic ROS: negative for - bleeding problems, bruising or swollen lymph nodes Endocrine ROS: negative for - galactorrhea, hair pattern changes, polydipsia/polyuria or temperature intolerance Respiratory ROS: negative for - cough, hemoptysis, shortness of breath or wheezing Cardiovascular ROS: negative for - chest pain, dyspnea on exertion, edema or irregular heartbeat Gastrointestinal ROS: negative for - abdominal pain, diarrhea, hematemesis, nausea/vomiting or stool incontinence Genito-Urinary ROS: negative for - dysuria, hematuria, incontinence or urinary frequency/urgency Musculoskeletal ROS: negative for - joint swelling or muscular weakness Neurological ROS: as noted in HPI Dermatological ROS: negative for rash and skin lesion changes     Physical exam: pleasant male in no apparent distress. Head: normocephalic. Neck: supple, no bruits, no JVD. Cardiac: no murmurs. Lungs: clear. Abdomen: soft, no tender, no mass. Extremities: no edema.  Blood pressure 136/89, pulse 81, temperature 98.7 F (37.1 C), temperature source Oral, resp. rate 18, height 5\' 5"  (1.651 m), weight 84.369 kg (186 lb), SpO2 98.00%.   Neurologic Examination:                                                                                                      Mental Status: Alert, oriented, thought content appropriate.  Speech fluent  without evidence of aphasia.  Able to follow 3 step commands without difficulty. Cranial Nerves: II: Discs flat bilaterally; Visual fields grossly normal, pupils equal, round, reactive to light and accommodation III,IV, VI: ptosis not present, extra-ocular motions intact bilaterally V,VII: smile symmetric, facial light touch sensation normal bilaterally VIII: hearing normal bilaterally IX,X: gag reflex present XI: bilateral shoulder shrug XII: midline tongue extension Motor: Right : Upper extremity   5/5    Left:     Upper extremity   5/5  Lower extremity   5/5     Lower extremity   5/5 Tone and bulk:normal tone throughout; no atrophy noted Sensory: Pinprick and light touch intact throughout, bilaterally Deep Tendon Reflexes:  Right: Upper Extremity   Left: Upper extremity   biceps (C-5 to C-6) 2/4   biceps (C-5 to C-6) 2/4 tricep (C7) 2/4    triceps (C7) 2/4 Brachioradialis (C6) 2/4  Brachioradialis (C6) 2/4  Lower Extremity Lower Extremity  quadriceps (L-2 to L-4) 2/4   quadriceps (L-2 to L-4) 2/4 Achilles (S1) 2/4   Achilles (S1) 2/4  Plantars: Right: downgoing   Left: downgoing Cerebellar: normal finger-to-nose,  normal heel-to-shin test Gait:  No ataxia. CV: pulses palpable throughout    Lab Results  Component Value Date/Time   CHOL 189 02/27/2013 12:54 PM    Results for orders placed during the hospital encounter of 04/17/13 (from the past 48  hour(s))  CBC WITH DIFFERENTIAL     Status: Abnormal   Collection Time    04/17/13  9:05 AM      Result Value Range   WBC 7.2  4.0 - 10.5 K/uL   RBC 4.85  4.22 - 5.81 MIL/uL   Hemoglobin 13.5  13.0 - 17.0 g/dL   HCT 16.1 (*) 09.6 - 04.5 %   MCV 79.2  78.0 - 100.0 fL   MCH 27.8  26.0 - 34.0 pg   MCHC 35.2  30.0 - 36.0 g/dL   RDW 40.9  81.1 - 91.4 %   Platelets 182  150 - 400 K/uL   Neutrophils Relative % 72  43 - 77 %   Neutro Abs 5.2  1.7 - 7.7 K/uL   Lymphocytes Relative 13  12 - 46 %   Lymphs Abs 1.0  0.7 - 4.0 K/uL    Monocytes Relative 13 (*) 3 - 12 %   Monocytes Absolute 1.0  0.1 - 1.0 K/uL   Eosinophils Relative 1  0 - 5 %   Eosinophils Absolute 0.1  0.0 - 0.7 K/uL   Basophils Relative 0  0 - 1 %   Basophils Absolute 0.0  0.0 - 0.1 K/uL  BASIC METABOLIC PANEL     Status: Abnormal   Collection Time    04/17/13  9:05 AM      Result Value Range   Sodium 133 (*) 135 - 145 mEq/L   Potassium 4.2  3.5 - 5.1 mEq/L   Chloride 99  96 - 112 mEq/L   CO2 23  19 - 32 mEq/L   Glucose, Bld 107 (*) 70 - 99 mg/dL   BUN 10  6 - 23 mg/dL   Creatinine, Ser 7.82  0.50 - 1.35 mg/dL   Calcium 9.1  8.4 - 95.6 mg/dL   GFR calc non Af Amer >90  >90 mL/min   GFR calc Af Amer >90  >90 mL/min   Comment:            The eGFR has been calculated     using the CKD EPI equation.     This calculation has not been     validated in all clinical     situations.     eGFR's persistently     <90 mL/min signify     possible Chronic Kidney Disease.    Mr Brain Wo Contrast  04/17/2013   *RADIOLOGY REPORT*  Clinical Data: Bug bite.  Dizziness when turning head fast. Hypertension.  MRI HEAD WITHOUT CONTRAST  Technique:  Multiplanar, multiecho pulse sequences of the brain and surrounding structures were obtained according to standard protocol without intravenous contrast.  Comparison: 09/14/2007.  Findings: Motion degraded exam.  Minimal increased signal right lateral medulla (series 4 image 7), this may be related to artifact however, given the patient's history of dizziness and high blood pressure, a tiny infarct is difficult to completely excluded.  Otherwise no evidence of acute infarct.  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  No intracranial hemorrhage.  No hydrocephalus.  No intracranial mass lesion detected on this unenhanced exam.  Minimal nonspecific white matter hyperintensity anterior left periopercular region may be related to  small vessel disease or migraine headaches.  Mild spinal stenosis C3-4.  Cervical medullary junction, pituitary region, pineal region and orbital structures unremarkable.  Minimal paranasal sinus mucosal thickening.  IMPRESSION: Motion degraded exam.  Minimal increased signal  right lateral medulla (series 4 image 7), this may be related to artifact however, given the patient's history of dizziness and high blood pressure, a tiny infarct is difficult to excluded. Additionally, there is an abnormal appearance of the right vertebral artery which although congenitally small also appears narrowed or occluded.   Original Report Authenticated By: Lacy Duverney, M.D.     Assessment/Plan: 52 y/o male with HTN and new onset isolated vertigo and non focal neurological examination. ? Right lateral medulla ischemia. I am not quite sure the MRI findings are conclusively consistent with an ischemic stroke, but he has history of HTN and isolated vertigo has been described as the sole presentation in a small percentage of brainstem infarcts. Recommend: 1) aspirin 81 mg daily. 2) complete stroke work up 3) Stroke team to make further recommendations in the morning.Wyatt Portela, MD [facility] [phone]  04/17/2013, 1:19 PM

## 2013-04-17 NOTE — H&P (Signed)
  I have reviewed the note by Roxanne Mins and was present during the interview and physical exam.  Please see my separate note for full details.    Signed: Rocco Serene, MD 04/17/2013, 8:20 PM

## 2013-04-17 NOTE — ED Notes (Signed)
Report given to floor. Vascular lab calling, coming to get patient for carotid doppler, from there will go to the floor. 5W made aware and agreeable with this plan.

## 2013-04-17 NOTE — ED Notes (Signed)
Pt is resting, requesting something to eat. Pt reports dizziness when turning head to right side. Pt moves all extremities. No weakness detected. Pt reports stuttering at baseline. Will continue to monitor.

## 2013-04-18 ENCOUNTER — Observation Stay (HOSPITAL_COMMUNITY): Payer: Self-pay

## 2013-04-18 ENCOUNTER — Observation Stay (HOSPITAL_COMMUNITY): Payer: MEDICAID

## 2013-04-18 DIAGNOSIS — R509 Fever, unspecified: Secondary | ICD-10-CM | POA: Diagnosis present

## 2013-04-18 LAB — LIPID PANEL
Cholesterol: 152 mg/dL (ref 0–200)
HDL: 47 mg/dL (ref >39)
Triglycerides: 67 mg/dL (ref <150)

## 2013-04-18 LAB — HIV ANTIBODY (ROUTINE TESTING W REFLEX): HIV: NONREACTIVE

## 2013-04-18 LAB — URINALYSIS, ROUTINE W REFLEX MICROSCOPIC
Bilirubin Urine: NEGATIVE
Ketones, ur: NEGATIVE mg/dL
Nitrite: NEGATIVE
Protein, ur: NEGATIVE mg/dL
Urobilinogen, UA: 0.2 mg/dL (ref 0.0–1.0)
pH: 5.5 (ref 5.0–8.0)

## 2013-04-18 LAB — RAPID URINE DRUG SCREEN, HOSP PERFORMED
Amphetamines: NOT DETECTED
Tetrahydrocannabinol: NOT DETECTED

## 2013-04-18 MED ORDER — SODIUM CHLORIDE 0.9 % IV SOLN
INTRAVENOUS | Status: DC
  Administered 2013-04-18 (×2): via INTRAVENOUS

## 2013-04-18 MED ORDER — TRAMADOL HCL 50 MG PO TABS: 100.0000 mg | ORAL_TABLET | Freq: Four times a day (QID) | ORAL | Status: DC | PRN

## 2013-04-18 MED ORDER — TRAMADOL HCL 50 MG PO TABS
50.0000 mg | ORAL_TABLET | Freq: Four times a day (QID) | ORAL | Status: DC | PRN
  Administered 2013-04-18: 50 mg via ORAL
  Filled 2013-04-18: qty 1

## 2013-04-18 MED ORDER — HYDRALAZINE HCL 20 MG/ML IJ SOLN: 10.0000 mg | INTRAMUSCULAR | Status: DC | PRN

## 2013-04-18 MED ORDER — TRAMADOL HCL 50 MG PO TABS: 50.0000 mg | ORAL_TABLET | Freq: Once | ORAL | Status: DC

## 2013-04-18 NOTE — Care Management Note (Signed)
    Page 1 of 1   04/19/2013     10:46:45 AM   CARE MANAGEMENT NOTE 04/19/2013  Patient:  Ivan Murphy, Ivan Murphy   Account Number:  [id number]  Date Initiated:  04/18/2013  Documentation initiated by:  Letha Cape  Subjective/Objective Assessment:   dx dizziness  admit as observation- pta indep. from [facility].  They provide transportation to pick pt up and also help purchase his medications.     Action/Plan:   Anticipated DC Date:  04/19/2013   Anticipated DC Plan:  HOME/SELF CARE      DC Planning Services  CM consult      Choice offered to / List presented to:             Status of service:  Completed, signed off Medicare Important Message given?   (If response is "NO", the following Medicare IM given date fields will be blank) Date Medicare IM given:   Date Additional Medicare IM given:    Discharge Disposition:  HOME/SELF CARE  Per UR Regulation:  Reviewed for med. necessity/level of care/duration of stay  If discussed at Long Length of Stay Meetings, dates discussed:    Comments:  04/19/13 10:45 Letha Cape RN, BSN [phone] patient is for dc back to [facility] today, no needs anticipated.  04/18/13 11:59 Letha Cape RN, BSN [phone] patient is from the [facility], they will provide transportation for patient at discharge and they also have a green card to help patient get his medications.  No needs anticipated.

## 2013-04-18 NOTE — Progress Notes (Addendum)
S: Was called that pt had 72 F and despite tylenol still febrile with worsening complaint of HA. Pt was not having photophobia or odynophobia, repeat dizziness, or back/neck pain. Pt also denied any SOB, CP, urinary dysuria, abdominal pain, nausea/vomiting/diarrhea, or visual disturbances.   O:  Filed Vitals:   04/17/13 2250  BP:   Pulse:   Temp: 100.4 F (38 C)  Resp:    General: resting in bed, some acute distress HEENT: PERRL, EOMI, no scleral icterus Cardiac: tachy, RR, no rubs, murmurs or gallops Pulm: clear to auscultation bilaterally, moving normal volumes of air Abd: soft, nontender, nondistended, BS present Ext: warm and well perfused, no pedal edema, full skin evaluation didn't reveal petechiae or rashes Neuro: alert and oriented X3, normal shoulder shrug 5/5 bilaterally, initial exam UE strength LUE 3/5 vs RUE 5/5, decreased LE strength 4/5 bilaterally, negative babinski  Dr. Marin Shutter of Critical Care was consulted for LP and evaluation. Repeat examination with Dr. Marin Shutter showed resolution of neurological deficits but pt still complaining of frontal HA. Patient management discussed with Dr. Marin Shutter and recommended holding off on Abx unless patient spiked another fever, pancultures, and possible MRI of spine looking for abscess if neurological deficits returned as presentation atypical for meningitis. Differential of DVT/PE, autoimmune or other drug side effects was had with Critical care.   A/P: -BCx x2 -Urine culture -CXR -tylenol prn fever -UDS -neurochecks  Christen Bame, MD PGY-2 [phone]

## 2013-04-18 NOTE — Progress Notes (Signed)
OT Cancellation Note  Patient Details Name: ABUBAKR WIEMAN MRN: [id number] DOB: [birth year]   Cancelled Treatment:    Reason Eval/Treat Not Completed: Other (comment)--awaiting PT vestibular eval to determine if OT is needed due to no separate OT order.  Evette Georges [phone] 04/18/2013, 2:02 PM

## 2013-04-18 NOTE — Progress Notes (Signed)
Subjective:  Overnight [name] spiked and maintained a temperature between 101-102, despite receiving tylenol. Additionally, he continued to complain of worsening headache but denied any photophobia, odynophobia, repeat dizziness, back or neck pain. Critical care was consulted and LP considered for atypical meningitis, but held off based on resolution of neurologic symptoms initally noted.  This morning [name] continues to report worsening frontal headache and generalized weakness, with resolution of dizziness. He continues to deny photophobia and odynophobia. He denies any cough, sputum production or sick contacts. He denies any change in urination, no increased frequency or burning. He denies abdominal pain, but reports several episodes of non bloody diarrhea in the past few days, since the onset of symptoms. Additionally he reports loss of appetite, but admits to eating his breakfast and maintaining fluid intake. He reports the bite on his arm is not painful, but mildly edematous and erythematous.  Objective: Vital signs in last 24 hours: Filed Vitals:   04/17/13 1725 04/17/13 2045 04/17/13 2250 04/18/13 0517  BP: 138/84 129/81  120/68  Pulse: 77 95  101  Temp: 100.5 F (38.1 C) 102.8 F (39.3 C) 100.4 F (38 C) 101.8 F (38.8 C)  TempSrc: Oral Oral Oral Oral  Resp: 20 16  16   Height: 5\' 5"  (1.651 m)     Weight: 84.188 kg (185 lb 9.6 oz)     SpO2: 96% 92%  95%   Weight change: -0.181 kg (-6.4 oz)  Intake/Output Summary (Last 24 hours) at 04/18/13 1610 Last data filed at 04/18/13 0750  Gross per 24 hour  Intake      0 ml  Output    250 ml  Net   -250 ml   Physical Exam  General: resting upon entering room, well appearing male, alert and engaged and in no acute distress  HEENT: pupils equal, round and reactive to light, moist mucous membranes  Heart: regular rate and rhythm, no gallops or rubs appreciated  Lungs: good respiratory effort, lungs clear to ascultation  bilaterally,no wheezes, rales or rhonchi  Abdomen: soft, non-tender abdomen, normal active bowel sounds, no rebound or guarding  Extremities: no peripheral edema bilaterally  Neuro: alert and oriented x3, cranial nerves II-XII grossly intact, strength and sensation grossly intact in upper and lower extremities bilaterally  Lab Results: @LABTEST2 @ Micro Results: No results found for this or any previous visit (from the past 240 hour(s)). Studies/Results: Dg Chest 2 View  04/18/2013   *RADIOLOGY REPORT*  Clinical Data: Fever  CHEST - 2 VIEW  Comparison: 02/27/2013  Findings: The heart size and pulmonary vascularity are normal and the lungs are clear.  No effusions.  No acute osseous abnormality.  IMPRESSION: No acute disease.   Original Report Authenticated By: Francene Boyers, M.D.   Mr [facility] Wo Contrast  04/17/2013   *RADIOLOGY REPORT*  Clinical Data:  52 year old male with vertigo, weakness, headache.  Contrast: 17mL MULTIHANCE GADOBENATE DIMEGLUMINE 529 MG/ML IV SOLN  Comparison: Brain MRI 04/17/2013. [facility] Head and neck MRA 09/14/2007.  MRA NECK WITHOUT AND WITH CONTRAST  Technique:  Angiographic images of the neck were obtained using MRA technique without and with intravenous contrast.  Carotid stenosis measurements (when applicable) are obtained utilizing NASCET criteria, using the distal internal carotid diameter as the denominator.  Findings:  Precontrast time-of-flight imaging.  Antegrade flow in both carotid and vertebral arteries in the neck; the right vertebral artery is diminutive as before.  Stable time-of-flight appearance of the carotid bifurcations.  Postcontrast images with  a bovine arch configuration re-identified. Stable and negative great vessel origins.  Stable and negative right common carotid artery, right carotid bifurcation, and cervical right ICA.  Stable and negative left CCA, left carotid bifurcation, and cervical left ICA.  Both vertebral artery origins are  within normal limits, the left vertebral artery is dominant.  No vertebral artery stenosis in the neck.  Intracranial findings are below.  IMPRESSION: 1.  Stable and negative neck MRA.  Dominant left vertebral artery and bovine arch configuration incidentally noted. 2.  Intracranial findings are below.  MRA HEAD WITHOUT CONTRAST  Technique: Angiographic images of the Circle of Willis were obtained using MRA technique without  intravenous contrast.  Findings:  Dominant distal left vertebral artery is antegrade flow as before.  Less antegrade flow signal in the nondominant right vertebral artery, which appears to functionally terminating PICA as before.  Still, the contrast MRA appearance of the vessel was stable (above).  Patent basilar artery and AICA origins.  No basilar stenosis.  SCA and PCA origins are stable within normal limits.  Posterior communicating arteries are diminutive or absent.  Bilateral PCA branches are stable and within normal limits.  Antegrade flow in both ICA siphons.  No ICA stenosis.  Stable carotid termini, the right may be fenestrated.  Ophthalmic artery origins are within normal limits.  Stable MCA and ACA origins, the left ACA A1 segment is dominant.  The anterior communicating artery appears fenestrated.  ACA branches are stable and within normal limits allowing for some pulsation artifact.  Normal MCA origins. Visualized bilateral MCA branchesare stable and within normal limits.  IMPRESSION: Stable and negative intracranial MRA compared to 09/14/2007.   Original Report Authenticated By: Erskine Speed, M.D.   Mr Angiogram Neck W Wo Contrast  04/17/2013   *RADIOLOGY REPORT*  Clinical Data:  52 year old male with vertigo, weakness, headache.  Contrast: 17mL MULTIHANCE GADOBENATE DIMEGLUMINE 529 MG/ML IV SOLN  Comparison: Brain MRI 04/17/2013. [facility] Head and neck MRA 09/14/2007.  MRA NECK WITHOUT AND WITH CONTRAST  Technique:  Angiographic images of the neck were obtained  using MRA technique without and with intravenous contrast.  Carotid stenosis measurements (when applicable) are obtained utilizing NASCET criteria, using the distal internal carotid diameter as the denominator.  Findings:  Precontrast time-of-flight imaging.  Antegrade flow in both carotid and vertebral arteries in the neck; the right vertebral artery is diminutive as before.  Stable time-of-flight appearance of the carotid bifurcations.  Postcontrast images with a bovine arch configuration re-identified. Stable and negative great vessel origins.  Stable and negative right common carotid artery, right carotid bifurcation, and cervical right ICA.  Stable and negative left CCA, left carotid bifurcation, and cervical left ICA.  Both vertebral artery origins are within normal limits, the left vertebral artery is dominant.  No vertebral artery stenosis in the neck.  Intracranial findings are below.  IMPRESSION: 1.  Stable and negative neck MRA.  Dominant left vertebral artery and bovine arch configuration incidentally noted. 2.  Intracranial findings are below.  MRA HEAD WITHOUT CONTRAST  Technique: Angiographic images of the Circle of Willis were obtained using MRA technique without  intravenous contrast.  Findings:  Dominant distal left vertebral artery is antegrade flow as before.  Less antegrade flow signal in the nondominant right vertebral artery, which appears to functionally terminating PICA as before.  Still, the contrast MRA appearance of the vessel was stable (above).  Patent basilar artery and AICA origins.  No basilar stenosis.  SCA and  PCA origins are stable within normal limits.  Posterior communicating arteries are diminutive or absent.  Bilateral PCA branches are stable and within normal limits.  Antegrade flow in both ICA siphons.  No ICA stenosis.  Stable carotid termini, the right may be fenestrated.  Ophthalmic artery origins are within normal limits.  Stable MCA and ACA origins, the left ACA A1  segment is dominant.  The anterior communicating artery appears fenestrated.  ACA branches are stable and within normal limits allowing for some pulsation artifact.  Normal MCA origins. Visualized bilateral MCA branchesare stable and within normal limits.  IMPRESSION: Stable and negative intracranial MRA compared to 09/14/2007.   Original Report Authenticated By: Erskine Speed, M.D.   Mr Brain Wo Contrast  04/17/2013   *RADIOLOGY REPORT*  Clinical Data: Bug bite.  Dizziness when turning head fast. Hypertension.  MRI HEAD WITHOUT CONTRAST  Technique:  Multiplanar, multiecho pulse sequences of the brain and surrounding structures were obtained according to standard protocol without intravenous contrast.  Comparison: 09/14/2007.  Findings: Motion degraded exam.  Minimal increased signal right lateral medulla (series 4 image 7), this may be related to artifact however, given the patient's history of dizziness and high blood pressure, a tiny infarct is difficult to completely excluded.  Otherwise no evidence of acute infarct.  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  No intracranial hemorrhage.  No hydrocephalus.  No intracranial mass lesion detected on this unenhanced exam.  Minimal nonspecific white matter hyperintensity anterior left periopercular region may be related to small vessel disease or migraine headaches.  Mild spinal stenosis C3-4.  Cervical medullary junction, pituitary region, pineal region and orbital structures unremarkable.  Minimal paranasal sinus mucosal thickening.  IMPRESSION: Motion degraded exam.  Minimal increased signal right lateral medulla (series 4 image 7), this may be related to artifact however, given the patient's history of dizziness and high blood pressure, a tiny infarct is difficult to excluded. Additionally, there is an abnormal appearance of the right vertebral artery which  although congenitally small also appears narrowed or occluded.   Original Report Authenticated By: Lacy Duverney, M.D.   Medications: I have reviewed the patient's current medications. Scheduled Meds: . aspirin EC  81 mg Oral Daily  . enoxaparin (LOVENOX) injection  40 mg Subcutaneous Q24H  . sodium chloride  3 mL Intravenous Q12H   Continuous Infusions:  PRN Meds:.acetaminophen, acetaminophen, ondansetron (ZOFRAN) IV, ondansetron  Assessment/Plan:   [name] is a 52 yr AA male with a PMH of HTN and hyperlipidemia, who presents with acute onset of positional dizziness with accompanying headache and generalized weakness for 1 day.   #Fever with headache and generalized weakness  Patient reported worsening headache and generalized weakness with resolution of positional dizziness. Constellation of symptoms with development of persistent fever overnight make infectious cause most likely. Suspected source of infection is bug bite on right forearm that was debrided on admission.  The bite is mildly erythematous, edematous but without purulent drainage. Atypical meningitis is less likely due to clinical presentation and resolution of neurological symptoms, absence of neck/back stiffness, and normal CBC . UTI is less likely as the patient is asymptomatic with a negative UA. Urine cultures are still pending for rule out. CXR is inconclusive and will be repeated, however absence of shortness of breath, cough, and sputum production make pulmonary source of infection less likely.  - tramadol 50mg  PRN  - tylenol 650 mg PRN - CXR  pending - Blood and urine cultures pending - HIV pending  - Monitor temperature overnight  - Will be ready for discharge tomorrow  #Dizziness   Patient reports resolution of positional dizziness this morning. Dizziness was most likely due to orthostatic hypotension (133/74 lying, 114/79 standing) from persistent fever, and reported decreased PO intake and GI losses from  intermittent diarrhea x2days. Presentation was also concerning for benign paroxysmal positional vertigo, however resolution of symptoms without vestibulocochlear manipulation make this diagnosis less likely. Additionally meclazine and lorazepam, administered on admission, failed to resolve symptoms. Ischemic stroke was ruled out based on stable and negative MRA head and neck and carotid doppler. Although, MRI indicated questionable infarct in the right lateral medulla, as well as abnormal appearance of the right vertebral artery, there is no clear source of ischemia. Additionally, there is no other focal neurological or vestibulocochlear deficits which would be expected in a lateral medullary infarct (Wallenberg syndrome). - Discontinue telemetry  - Rehydrate with IV NS  - Monitor BP  # Insect bite  Patient presented with a painful erythematous, edematous insect bite on his right forearm. The site was debrided in the ED. It is currently wrapped with guaze and no overlying cellulitis is noted. Likely source of infection responsible for temperature. Ruling out other sources of infection. - Consider discharge on oral antibiotics for cellulitis if no other source of infection found  #Hyperlipidemia   Most recent lipid panel shows improved hyperlipidemia with a total cholesterol of 152, LDL 92, HDL 47, TG 67. -Continue simvistatin 40mg    #Hypertension   History of hypertension documented, but not medically controlled. Patient remains normotensive with systolic pressures in the 120-140s.   #History of ETOH/ tobacco and cocaine abuse  Patient lives at [facility] house, a drug and alcohol rehabilitation center. Reports being sober for several months. UDS came back negative.   This is a Psychologist, occupational Note.  The care of the patient was discussed with Dr. Aundria Rud and the assessment and plan formulated with their assistance.  Please see their attached note for official documentation of the daily encounter.    LOS: 1 day   Everardo All, Med Student 04/18/2013, 8:29 AM

## 2013-04-18 NOTE — Progress Notes (Signed)
Pt spiked fever of 102.5. Pt given 650 mg of tylenol po. Paged MD on call to see about blood cultures or any new orders. No new orders given at this time will continue to monitor pt.

## 2013-04-18 NOTE — Progress Notes (Signed)
Stroke Team Progress Note  HISTORY Ivan Murphy is an 52 y.o. male with a past medical history significant for HTN, s/p cardiac cath 6/14, comes in 04/17/2013 complaining of acute onset vertigo, generalized weakness, and headache. State that never had vertigo before, but since last night has been experiencing " a severe spinning sensation" that is worsened by head movements but no associated with nausea, vomiting, double vision, dysequilibrium, focal weakness or numbness, slurred speech, language or vision impairment. No recent fever, infection, head or neck trauma. No tinnitus or hearing impairment. CT brain was unremarkable. MRI-DWI showed minimal increased signal right lateral medulla, ? Artifact versus acute ischemia.   Patient was not a TPA candidate secondary to delay in arrival. He was admitted  for further evaluation and treatment.  SUBJECTIVE No family is at the bedside.  Overall he feels his condition is stable. He reports initially feeling hot and dizzy. No focal neurologic findings.  OBJECTIVE Most recent Vital Signs: Filed Vitals:   04/17/13 1725 04/17/13 2045 04/17/13 2250 04/18/13 0517  BP: 138/84 129/81  120/68  Pulse: 77 95  101  Temp: 100.5 F (38.1 C) 102.8 F (39.3 C) 100.4 F (38 C) 101.8 F (38.8 C)  TempSrc: Oral Oral Oral Oral  Resp: 20 16  16   Height: 5\' 5"  (1.651 m)     Weight: 84.188 kg (185 lb 9.6 oz)     SpO2: 96% 92%  95%   CBG (last 3)  No results found for this basename: GLUCAP,  in the last 72 hours  IV Fluid Intake:     MEDICATIONS  . aspirin EC  81 mg Oral Daily  . enoxaparin (LOVENOX) injection  40 mg Subcutaneous Q24H  . sodium chloride  3 mL Intravenous Q12H   PRN:  acetaminophen, acetaminophen, ondansetron (ZOFRAN) IV, ondansetron, traMADol  Diet:  Cardiac thin liquids Activity:  Up as tolerated DVT Prophylaxis:  Lovenox 40 mg sq daily   CLINICALLY SIGNIFICANT STUDIES Basic Metabolic Panel:   Recent Labs Lab 04/17/13 0905  NA 133*   K 4.2  CL 99  CO2 23  GLUCOSE 107*  BUN 10  CREATININE 0.92  CALCIUM 9.1   Liver Function Tests: No results found for this basename: AST, ALT, ALKPHOS, BILITOT, PROT, ALBUMIN,  in the last 168 hours CBC:   Recent Labs Lab 04/17/13 0905  WBC 7.2  NEUTROABS 5.2  HGB 13.5  HCT 38.4*  MCV 79.2  PLT 182   Coagulation: No results found for this basename: LABPROT, INR,  in the last 168 hours Cardiac Enzymes:   Recent Labs Lab 04/17/13 1321  TROPONINI <0.30   Urinalysis:   Recent Labs Lab 04/18/13 0733  COLORURINE YELLOW  LABSPEC 1.020  PHURINE 5.5  GLUCOSEU NEGATIVE  HGBUR NEGATIVE  BILIRUBINUR NEGATIVE  KETONESUR NEGATIVE  PROTEINUR NEGATIVE  UROBILINOGEN 0.2  NITRITE NEGATIVE  LEUKOCYTESUR NEGATIVE   Lipid Panel    Component Value Date/Time   CHOL 152 04/18/2013 0600   TRIG 67 04/18/2013 0600   HDL 47 04/18/2013 0600   CHOLHDL 3.2 04/18/2013 0600   VLDL 13 04/18/2013 0600   LDLCALC 92 04/18/2013 0600   HgbA1C  Lab Results  Component Value Date   HGBA1C 5.5 02/27/2013    Urine Drug Screen:     Component Value Date/Time   LABOPIA NONE DETECTED 04/18/2013 0733   COCAINSCRNUR NONE DETECTED 04/18/2013 0733   LABBENZ NONE DETECTED 04/18/2013 0733   AMPHETMU NONE DETECTED 04/18/2013 0733   THCU NONE DETECTED  04/18/2013 0733   LABBARB NONE DETECTED 04/18/2013 0733    Alcohol Level: No results found for this basename: ETH,  in the last 168 hours  CT of the brain    MRI of the brain 04/17/2013    Motion degraded exam.  Minimal increased signal right lateral medulla (series 4 image 7), this may be related to artifact however, given the patient's history of dizziness and high blood pressure, a tiny infarct is difficult to excluded. Additionally, there is an abnormal appearance of the right vertebral artery which although congenitally small also appears narrowed or occluded.      MRA of the brain  04/17/2013   Stable and negative intracranial MRA compared to 09/14/2007.     MRA of the neck  04/17/2013   1.  Stable and negative neck MRA.  Dominant left vertebral artery and bovine arch configuration incidentally noted.    2D Echocardiogram  02/27/2013 No significant heart disease beyond Grade 1 Diastolic. Dysfunction with Concentric Remodeling. EF 50-60%  Carotid Doppler  No evidence of hemodynamically significant internal carotid artery stenosis. Vertebral artery flow is antegrade.   CXR  04/18/2013   No acute disease.   EKG  normal sinus rhythm, nonspecific  T wave changes.   Therapy Recommendations   Physical Exam   Pleasant middle aged african [place] male not in distressAwake alert. Afebrile. Head is nontraumatic. Neck is supple without bruit. Hearing is normal. Cardiac exam no murmur or gallop. Lungs are clear to auscultation. Distal pulses are well felt. .Neurological Exam ;  Awake  Alert oriented x 3. Normal speech and language.eye movements full without nystagmus.fundi were not visualized. Vision acuity and fields appear normal. Hearing is normal. Palatal movements are normal. Face symmetric. Tongue midline. Normal strength, tone, reflexes and coordination. Normal sensation. Gait deferred. ASSESSMENT Ivan Murphy is a 52 y.o. male presenting with acute onset dizziness, generalized weakness, and headache. Imaging reports a right lateral medullar artifact vs infarct. Based on presentation along with history of previous similar symptoms within the last 3 mos, and unconvincing imaging correlation, stroke/TIA  not felt to be in the differential. On aspirin 81 mg orally every day prior to admission. Now on aspirin 81 mg orally every day for secondary stroke prevention. Patient with no resultant neuro defictis.   Hypertension Cigarette smoker, quit 2 months ago Febrile, temp max 102.5   Hospital day # 1  TREATMENT/PLAN  Continue aspirin 81 mg orally every day for stroke prevention. No further stroke workup indicated. Stroke Service will sign off.  Please call should any needs arise.  Annie Main, MSN, RN, ANVP-BC, ANP-BC, [facility] Pager: [phone] 04/18/2013 11:45 AM  I have personally obtained a history, examined the patient, evaluated imaging results, and formulated the assessment and plan of care. I agree with the above. Delia Heady, MD

## 2013-04-18 NOTE — H&P (Signed)
I saw and evaluated the patient. I reviewed the resident's note and confirmed the resident's findings.  I agree with the assessment and plan as documented in the resident's note.  Briefly, Ivan Murphy is a 52 yo man who presented with dizziness and was admitted for further evaluation.  An MRI revealed a questionable medullary lesion which was not completely consistent with the physical examination.  He is not felt to have had a neurological event at this time.  He was also found to be orthostatic (has had 2 days of diarrhea) and this could have explained his dizziness symptoms.  Finally, he had an equivocal Dix-Hallpike maneuver on admission but did not symptomatically respond to meclizine given in the ED.  Last night he spiked a fever to 102.8.  Fever evaluation to date has been unremarkable (negative UA, CXR, cultures to date).  He did have an I&D of a minor lesion on his arm (? bug bite) but I am hesitant to attribute his fevers to that lesion, especially in light of its size and that fact that it was I&D'd.   I suspect Ivan Murphy developed an acute viral enteritis with diarrhea resulting in dehydration and orthostatic hypotension that lead to his dizziness.  The fever was likely a manifestation of this viral infection.  We will continue with IV hydration and monitoring him clinically w/o treating with antibiotics unless he worsens or we identify a viable infectious source.

## 2013-04-18 NOTE — Progress Notes (Signed)
Medical Service: Internal Medicine Teaching Service     I have reviewed the note by Roxanne Mins and was present during the interview and physical exam.  Please seemy note for full details.   Signed: Rocco Serene, MD 04/18/2013, 5:49 PM

## 2013-04-18 NOTE — Progress Notes (Signed)
MD at bedside. 

## 2013-04-18 NOTE — Progress Notes (Addendum)
Subjective: Ivan Murphy is feeling about the same this morning, still complaining of headache though dizziness is improved. Pt eating well.   Pt had fever to 102.8 overnight, endorses continued headache but no photo/phophobia or nuchal rigidity. Unclear etiology of fever.  Will follow-up blood cultures x 2, UA and CXR. Temp down to 99.2 now.   Objective: Vital signs in last 24 hours: Filed Vitals:   04/17/13 2045 04/17/13 2250 04/18/13 0517 04/18/13 1254  BP: 129/81  120/68 124/82  Pulse: 95  101 74  Temp: 102.8 F (39.3 C) 100.4 F (38 C) 101.8 F (38.8 C) 99.2 F (37.3 C)  TempSrc: Oral Oral Oral Oral  Resp: 16  16 18   Height:      Weight:      SpO2: 92%  95% 94%   Weight change: -6.4 oz (-0.181 kg)  Intake/Output Summary (Last 24 hours) at 04/18/13 1417 Last data filed at 04/18/13 0750  Gross per 24 hour  Intake      0 ml  Output    250 ml  Net   -250 ml   PEX General: alert, cooperative, and in no apparent distress HEENT: pupils equal round and reactive to light, vision grossly intact, oropharynx clear and non-erythematous  Neck: supple, no lymphadenopathy, JVD, or carotid bruits Lungs: clear to ascultation bilaterally, normal work of respiration, no wheezes, rales, ronchi Heart: regular rate and rhythm, no murmurs, gallops, or rubs Abdomen: soft, non-tender, non-distended, normal bowel sounds Extremities: right arm with small abscess s/p I&D, some edema, no erythema, warmth, or drainage; no cyanosis, clubbing, or edema Neurologic: alert & oriented X3, cranial nerves II-XII intact, strength grossly intact, sensation intact to light touch  Lab Results: Basic Metabolic Panel:  Recent Labs Lab 04/17/13 0905  NA 133*  K 4.2  CL 99  CO2 23  GLUCOSE 107*  BUN 10  CREATININE 0.92  CALCIUM 9.1   CBC:  Recent Labs Lab 04/17/13 0905  WBC 7.2  NEUTROABS 5.2  HGB 13.5  HCT 38.4*  MCV 79.2  PLT 182   Cardiac Enzymes:  Recent Labs Lab 04/17/13 1321    TROPONINI <0.30   Fasting Lipid Panel:  Recent Labs Lab 04/18/13 0600  CHOL 152  HDL 47  LDLCALC 92  TRIG 67  CHOLHDL 3.2   Urine Drug Screen: Drugs of Abuse     Component Value Date/Time   LABOPIA NONE DETECTED 04/18/2013 0733   COCAINSCRNUR NONE DETECTED 04/18/2013 0733   LABBENZ NONE DETECTED 04/18/2013 0733   AMPHETMU NONE DETECTED 04/18/2013 0733   THCU NONE DETECTED 04/18/2013 0733   LABBARB NONE DETECTED 04/18/2013 0733    Urinalysis:  Recent Labs Lab 04/18/13 0733  COLORURINE YELLOW  LABSPEC 1.020  PHURINE 5.5  GLUCOSEU NEGATIVE  HGBUR NEGATIVE  BILIRUBINUR NEGATIVE  KETONESUR NEGATIVE  PROTEINUR NEGATIVE  UROBILINOGEN 0.2  NITRITE NEGATIVE  LEUKOCYTESUR NEGATIVE   Studies/Results: Dg Chest 2 View  04/18/2013   *RADIOLOGY REPORT*  Clinical Data: Fever  CHEST - 2 VIEW  Comparison: 02/27/2013  Findings: The heart size and pulmonary vascularity are normal and the lungs are clear.  No effusions.  No acute osseous abnormality.  IMPRESSION: No acute disease.   Original Report Authenticated By: Francene Boyers, M.D.   Ivan Murphy Wo Contrast  04/17/2013   *RADIOLOGY REPORT*  Clinical Data:  52 year old male with vertigo, weakness, headache.  Contrast: 17mL MULTIHANCE GADOBENATE DIMEGLUMINE 529 MG/ML IV SOLN  Comparison: Brain MRI 04/17/2013. [facility] and  neck MRA 09/14/2007.  MRA NECK WITHOUT AND WITH CONTRAST  Technique:  Angiographic images of the neck were obtained using MRA technique without and with intravenous contrast.  Carotid stenosis measurements (when applicable) are obtained utilizing NASCET criteria, using the distal internal carotid diameter as the denominator.  Findings:  Precontrast time-of-flight imaging.  Antegrade flow in both carotid and vertebral arteries in the neck; the right vertebral artery is diminutive as before.  Stable time-of-flight appearance of the carotid bifurcations.  Postcontrast images with a bovine arch configuration  re-identified. Stable and negative great vessel origins.  Stable and negative right common carotid artery, right carotid bifurcation, and cervical right ICA.  Stable and negative left CCA, left carotid bifurcation, and cervical left ICA.  Both vertebral artery origins are within normal limits, the left vertebral artery is dominant.  No vertebral artery stenosis in the neck.  Intracranial findings are below.  IMPRESSION: 1.  Stable and negative neck MRA.  Dominant left vertebral artery and bovine arch configuration incidentally noted. 2.  Intracranial findings are below.  MRA HEAD WITHOUT CONTRAST  Technique: Angiographic images of the Circle of Willis were obtained using MRA technique without  intravenous contrast.  Findings:  Dominant distal left vertebral artery is antegrade flow as before.  Less antegrade flow signal in the nondominant right vertebral artery, which appears to functionally terminating PICA as before.  Still, the contrast MRA appearance of the vessel was stable (above).  Patent basilar artery and AICA origins.  No basilar stenosis.  SCA and PCA origins are stable within normal limits.  Posterior communicating arteries are diminutive or absent.  Bilateral PCA branches are stable and within normal limits.  Antegrade flow in both ICA siphons.  No ICA stenosis.  Stable carotid termini, the right may be fenestrated.  Ophthalmic artery origins are within normal limits.  Stable MCA and ACA origins, the left ACA A1 segment is dominant.  The anterior communicating artery appears fenestrated.  ACA branches are stable and within normal limits allowing for some pulsation artifact.  Normal MCA origins. Visualized bilateral MCA branchesare stable and within normal limits.  IMPRESSION: Stable and negative intracranial MRA compared to 09/14/2007.   Original Report Authenticated By: Erskine Speed, M.D.   Ivan Angiogram Neck W Wo Contrast  04/17/2013   *RADIOLOGY REPORT*  Clinical Data:  52 year old male with  vertigo, weakness, headache.  Contrast: 17mL MULTIHANCE GADOBENATE DIMEGLUMINE 529 MG/ML IV SOLN  Comparison: Brain MRI 04/17/2013. [facility] Head and neck MRA 09/14/2007.  MRA NECK WITHOUT AND WITH CONTRAST  Technique:  Angiographic images of the neck were obtained using MRA technique without and with intravenous contrast.  Carotid stenosis measurements (when applicable) are obtained utilizing NASCET criteria, using the distal internal carotid diameter as the denominator.  Findings:  Precontrast time-of-flight imaging.  Antegrade flow in both carotid and vertebral arteries in the neck; the right vertebral artery is diminutive as before.  Stable time-of-flight appearance of the carotid bifurcations.  Postcontrast images with a bovine arch configuration re-identified. Stable and negative great vessel origins.  Stable and negative right common carotid artery, right carotid bifurcation, and cervical right ICA.  Stable and negative left CCA, left carotid bifurcation, and cervical left ICA.  Both vertebral artery origins are within normal limits, the left vertebral artery is dominant.  No vertebral artery stenosis in the neck.  Intracranial findings are below.  IMPRESSION: 1.  Stable and negative neck MRA.  Dominant left vertebral artery and bovine arch configuration incidentally noted. 2.  Intracranial findings are below.  MRA HEAD WITHOUT CONTRAST  Technique: Angiographic images of the Circle of Willis were obtained using MRA technique without  intravenous contrast.  Findings:  Dominant distal left vertebral artery is antegrade flow as before.  Less antegrade flow signal in the nondominant right vertebral artery, which appears to functionally terminating PICA as before.  Still, the contrast MRA appearance of the vessel was stable (above).  Patent basilar artery and AICA origins.  No basilar stenosis.  SCA and PCA origins are stable within normal limits.  Posterior communicating arteries are diminutive or  absent.  Bilateral PCA branches are stable and within normal limits.  Antegrade flow in both ICA siphons.  No ICA stenosis.  Stable carotid termini, the right may be fenestrated.  Ophthalmic artery origins are within normal limits.  Stable MCA and ACA origins, the left ACA A1 segment is dominant.  The anterior communicating artery appears fenestrated.  ACA branches are stable and within normal limits allowing for some pulsation artifact.  Normal MCA origins. Visualized bilateral MCA branchesare stable and within normal limits.  IMPRESSION: Stable and negative intracranial MRA compared to 09/14/2007.   Original Report Authenticated By: Erskine Speed, M.D.   Ivan Brain Wo Contrast  04/17/2013   *RADIOLOGY REPORT*  Clinical Data: Bug bite.  Dizziness when turning head fast. Hypertension.  MRI HEAD WITHOUT CONTRAST  Technique:  Multiplanar, multiecho pulse sequences of the brain and surrounding structures were obtained according to standard protocol without intravenous contrast.  Comparison: 09/14/2007.  Findings: Motion degraded exam.  Minimal increased signal right lateral medulla (series 4 image 7), this may be related to artifact however, given the patient's history of dizziness and high blood pressure, a tiny infarct is difficult to completely excluded.  Otherwise no evidence of acute infarct.  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  No intracranial hemorrhage.  No hydrocephalus.  No intracranial mass lesion detected on this unenhanced exam.  Minimal nonspecific white matter hyperintensity anterior left periopercular region may be related to small vessel disease or migraine headaches.  Mild spinal stenosis C3-4.  Cervical medullary junction, pituitary region, pineal region and orbital structures unremarkable.  Minimal paranasal sinus mucosal thickening.  IMPRESSION: Motion degraded exam.  Minimal increased signal  right lateral medulla (series 4 image 7), this may be related to artifact however, given the patient's history of dizziness and high blood pressure, a tiny infarct is difficult to excluded. Additionally, there is an abnormal appearance of the right vertebral artery which although congenitally small also appears narrowed or occluded.   Original Report Authenticated By: Lacy Duverney, M.D.   Medications: I have reviewed the patient's current medications. Scheduled Meds: . aspirin EC  81 mg Oral Daily  . enoxaparin (LOVENOX) injection  40 mg Subcutaneous Q24H  . sodium chloride  3 mL Intravenous Q12H  . traMADol  50 mg Oral Once   Continuous Infusions:  PRN Meds:.acetaminophen, acetaminophen, ondansetron (ZOFRAN) IV, ondansetron, traMADol Assessment/Plan: Ivan Murphy is a 52 y/o man with history of HL on simvastatin who was admitted for stroke workup due to chief complaint of dizziness in setting of MRI with minimal increased signal in right lateral medulla vs. artifact, abnormal appearance of right vertebral artery. However, pt had subsequent negative head and neck MRA.  Pt was to be discharged today but spiked a fever to 102.8 overnight so now completing infectious workup.  #Fever- Pt spiked a fever to 102.8 evening  of 7/22, temp now 99.2. Etiology unclear. UA negative, AP CXR read with no acute disease but will repeat CXR with PA and lateral films to fully evaluate. Given fever and orthostatic hypotension measured by medical student today (see below), will start pt on IVF and re-evaluate in AM.  -follow-up blood cultures -NS at 75 c/hr -repeat CXR with PA and lateral films -tylenol and tramadol for headache  #Dizziness, resolved- BPPV vs. orthostatic hypotension. Pt with dizziness only that is now resolved, normal neuro exam.  MRI showed possible infarction in right lateral medulla and abnormal appearance of right vertebral artery.  MRA showed stable and negative intracranial and neck MRA therefore  CVA very unlikely. Neuro following.  Carotid dopplers with 39% stenosis bilaterally.  Pt had ?positive Dix Hallpike maneuver assessing for BPPV- symptoms were reproducible but no nystagmus noted.  BPPV relatively unlikely given that pt did not respond to meclazine in ED.  Orthostatics negative afternoon of 7/22 but positive this afternoon as measured by medical student (decrease in systolic BP from 213 lying to 086 standing); pt was asymptomatic during measurement. EKG unchanged from prior.  UDS negative.  -neuro following, appreciate recs  -discontinue telemetry  -repeat orthostatic vitals in morning  -PT/OT vestibular eval   # HL- Pt had FLP on 04/04/13 showing LDL 107 on simvistatin 40mg  at home. Repeat FLP on 7/23 with LDL of 92. ASCVD risk 8.5% (counting pt as smoker given that he just quit in May).  -continue statin  # Insect bite- Pt reports sleeping with the windows open one night this week. Woke up with a painful insect bite on his right arm. S/p I&D in ED on 7/22 with purulent drainage. This may be possible source of infection though not particularly concerning given lack of erythema or current discharge.  If other sources of infection negative, will prescribe antibiotics for presumed skin infection.   # History of tobacco, EtOH, and cocaine abuse- Pt lives in a drug and alcohol rehab center. Has been sober for several months now.   # DVT PPX- lovenox   Disposition tomorrow.   The patient does have a current PCP (No Pcp Per Patient) and does need an [facility] hospital follow-up appointment after discharge. See below  Appt on Aug 6,2014 at 1100 for [facility].    .Services Needed at time of discharge: Y = Yes, Blank = No PT:   OT:   RN:   Equipment:   Other:     LOS: 1 day   Rocco Serene, MD 04/18/2013, 2:17 PM

## 2013-04-18 NOTE — Evaluation (Signed)
Physical Therapy Evaluation Patient Details Name: Ivan Murphy MRN: [id number] DOB: 07-22-1961 Today's Date: 04/18/2013 Time: 1191-4782 PT Time Calculation (min): 32 min  PT Assessment / Plan / Recommendation History of Present Illness  pt admitted after a 1 day progression of not feeling well to onset of dizziness of a spinning nature.  Clinical Impression  Conducted a basic Vestibular assessment-- based on subjective answers to questions, therapist opted to do occulomotor as well as testing for BPPV.  Saccades, pursuits and VOR WFL, head thrust negative.  Hallpike-Dix was negative for nystagmus and did not produce intense s/s.  Supine head roll was negative for nystagmus or s/s.  So posterior and horizontal BPPV ruled out and no R or L side hypofunction noted.  Subjective answer lead me to believe pt was sick or with infection on admission and the cause of vertigo is resolving.  No further PT needs at this time.  Will sign off.    PT Assessment  Patent does not need any further PT services    Follow Up Recommendations  No PT follow up    Does the patient have the potential to tolerate intense rehabilitation      Barriers to Discharge        Equipment Recommendations  None recommended by PT    Recommendations for Other Services     Frequency      Precautions / Restrictions Precautions Precautions: None Restrictions Weight Bearing Restrictions: No   Pertinent Vitals/Pain       Mobility  Bed Mobility Bed Mobility: Supine to Sit;Sit to Supine Supine to Sit: 7: Independent Sit to Supine: 7: Independent Transfers Transfers: Sit to Stand;Stand to Sit Sit to Stand: 7: Independent Stand to Sit: 7: Independent Ambulation/Gait Ambulation/Gait Assistance: 7: Independent Ambulation Distance (Feet): 380 Feet Assistive device: None Ambulation/Gait Assistance Details: generally steady, but noted mild dizziness and instability with horizontal head turn and head nod while  walking Gait Pattern: Within Functional Limits Gait velocity: WFL, but only when cued to keep going faster, not what pt would choose at this point Stairs: No Wheelchair Mobility Wheelchair Mobility: No    Exercises     PT Diagnosis:    PT Problem List:   PT Treatment Interventions:       PT Goals(Current goals can be found in the care plan section)    Visit Information  Last PT Received On: 04/18/13 Assistance Needed: +1 History of Present Illness: pt admitted after a 1 day progression of not feeling well to onset of dizziness of a spinning nature.       Prior Functioning  Home Living Family/patient expects to be discharged to:: Shelter/Homeless Living Arrangements: Non-relatives/Friends Prior Function Level of Independence: Independent Communication Communication: No difficulties    Cognition  Cognition Arousal/Alertness: Awake/alert Behavior During Therapy: WFL for tasks assessed/performed Overall Cognitive Status: Within Functional Limits for tasks assessed    Extremity/Trunk Assessment Lower Extremity Assessment Lower Extremity Assessment: Overall WFL for tasks assessed Cervical / Trunk Assessment Cervical / Trunk Assessment: Normal   Balance Balance Balance Assessed: No High Level Balance High Level Balance Activites: Backward walking;Direction changes;Turns;Sudden stops;Head turns (stepping over defined area.) High Level Balance Comments: These activities did not produce any overt deviation, though pt and therapist noticed mild instability with head turns and nods.  End of Session PT - End of Session Activity Tolerance: Patient tolerated treatment well Patient left: in bed Nurse Communication: Mobility status  GP     Ivan Murphy, Eliseo Gum 04/18/2013, 5:39 PM

## 2013-04-19 LAB — URINE CULTURE
Colony Count: NO GROWTH
Culture: NO GROWTH

## 2013-04-19 MED ORDER — SIMVASTATIN 20 MG PO TABS: 20.0000 mg | ORAL_TABLET | Freq: Every evening | ORAL | Status: DC
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
## 2013-04-19 NOTE — Progress Notes (Signed)
   I have seen the patient and reviewed the daily progress note by Roxanne Mins and discussed the care of the patient with them.  See separate note for documentation of my findings, assessment, and plans though Alanna did an excellent and thorough job documenting her findings and evaluation of the patient.    Rocco Serene, MD 04/19/2013, 9:12 PM

## 2013-04-19 NOTE — Progress Notes (Signed)
Juliann Mule to be D/C'd [facility] per MD order.  Discussed with the patient and all questions fully answered.    Medication List         acyclovir 400 MG tablet  Commonly known as:  ZOVIRAX  Take 1 tablet (400 mg total) by mouth 3 (three) times daily.     aspirin 81 MG chewable tablet  Chew 1 tablet (81 mg total) by mouth daily.     bacitracin ointment  Apply topically 2 (two) times daily.     simvastatin 20 MG tablet  Commonly known as:  ZOCOR  Take 1 tablet (20 mg total) by mouth every evening.        VVS, Skin clean, dry and intact without evidence of skin break down, no evidence of skin tears noted. IV catheter discontinued intact. Site without signs and symptoms of complications. Dressing and pressure applied.  An After Visit Summary was printed and given to the patient. Follow up appointments , new prescriptions and medication administration times given. Education given on fever. Patient escorted via WC, and D/C home via private auto.  Cindra Eves, RN 04/19/2013 12:39 PM

## 2013-04-19 NOTE — Progress Notes (Signed)
Internal Medicine Attending  Date: 04/19/2013  Patient name: Ivan [facility] Medical record number: [id number] Date of birth: 1960/10/09 Age: 52 y.o. Gender: male  I saw and evaluated the patient. I reviewed the resident's note by Dr. Aundria Rud and I agree with the resident's findings and plans as documented in her progress note.  [name] feels markedly better this morning and is w/o complaints.  He has had no further headaches or fevers.  He likely had a viral enteritis which has since resolved.  He has been adequately volume resuscitated so that he is no longer dizzy.  I agree with the housestaff's plan to discharge him home today.

## 2013-04-19 NOTE — Progress Notes (Signed)
Subjective:  Ivan Murphy remained afebrile overnight, maintaining a temperature between 98-99. Additionally, PT conducted a vestibular eval yesterday and definitively ruled out BPPV as underlying cause of dizziness.   This morning Ivan Murphy  reports significant improvement in symptoms, stating his dizziness, headache and generalized fatigue have all resolved. He continues to deny any pain, swelling or drainage around his bite wound. He reports his appetite has improved and endorses maintaining good fluid intake.  Denies any additional diarrhea. He states he is ready to go home today.   Objective: Vital signs in last 24 hours: Filed Vitals:   04/19/13 0558 04/19/13 1020 04/19/13 1025 04/19/13 1029  BP: 127/79 118/75 132/75 129/82  Pulse: 55     Temp: 98.5 F (36.9 C)     TempSrc: Oral     Resp: 18 18 20 18   Height:      Weight:      SpO2: 93%      Weight change:   Intake/Output Summary (Last 24 hours) at 04/19/13 1106 Last data filed at 04/19/13 0272  Gross per 24 hour  Intake   1200 ml  Output   1200 ml  Net      0 ml   Physical Exam  General: Significant clinical improvement, patient much more alert and engaged, smiling and moving around, in no acute distress HEENT: pupils equal, round and reactive to light, moist mucous membranes  Heart: regular rate and rhythm, no gallops or rubs appreciated  Lungs: good respiratory effort, lungs clear to ascultation bilaterally,no wheezes, rales or rhonchi  Abdomen: soft, non-tender abdomen, normal active bowel sounds, no rebound or guarding  Extremities: no peripheral edema bilaterally  Neuro: alert and oriented x3, cranial nerves II-XII grossly intact, strength and sensation grossly intact in upper and lower extremities bilaterally  Lab Results: @LABTEST2 @ Micro Results: Recent Results (from the past 240 hour(s))  CULTURE, BLOOD (ROUTINE X 2)     Status: None   Collection Time    04/18/13  1:55 AM      Result Value Range Status   Specimen Description BLOOD LEFT HAND   Final   Special Requests BOTTLES DRAWN AEROBIC ONLY 10CC   Final   Culture  Setup Time 04/18/2013 09:47   Final   Culture     Final   Value:        BLOOD CULTURE RECEIVED NO GROWTH TO DATE CULTURE WILL BE HELD FOR 5 DAYS BEFORE ISSUING A FINAL NEGATIVE REPORT   Report Status PENDING   Incomplete  CULTURE, BLOOD (ROUTINE X 2)     Status: None   Collection Time    04/18/13  2:12 AM      Result Value Range Status   Specimen Description BLOOD LEFT HAND   Final   Special Requests BOTTLES DRAWN AEROBIC ONLY 6CC   Final   Culture  Setup Time 04/18/2013 09:46   Final   Culture     Final   Value:        BLOOD CULTURE RECEIVED NO GROWTH TO DATE CULTURE WILL BE HELD FOR 5 DAYS BEFORE ISSUING A FINAL NEGATIVE REPORT   Report Status PENDING   Incomplete  URINE CULTURE     Status: None   Collection Time    04/18/13  7:33 AM      Result Value Range Status   Specimen Description URINE, RANDOM   Final   Special Requests Normal   Final   Culture  Setup Time 04/18/2013 07:33   Final  Colony Count NO GROWTH   Final   Culture NO GROWTH   Final   Report Status 04/19/2013 FINAL   Final   Studies/Results: Dg Chest 2 View  04/18/2013   *RADIOLOGY REPORT*  Clinical Data: Pneumonia.  CHEST - 2 VIEW  Comparison: 04/18/2013  Findings: Improved aeration of the lungs. Heart and mediastinal contours are within normal limits.  No focal opacities or effusions.  No acute bony abnormality.  IMPRESSION: Normal study.   Original Report Authenticated By: Charlett Nose, M.D.   Dg Chest 2 View  04/18/2013   *RADIOLOGY REPORT*  Clinical Data: Fever  CHEST - 2 VIEW  Comparison: 02/27/2013  Findings: The heart size and pulmonary vascularity are normal and the lungs are clear.  No effusions.  No acute osseous abnormality.  IMPRESSION: No acute disease.   Original Report Authenticated By: Francene Boyers, M.D.   Mr [facility] Wo Contrast  04/17/2013   *RADIOLOGY REPORT*  Clinical Data:   52 year old male with vertigo, weakness, headache.  Contrast: 17mL MULTIHANCE GADOBENATE DIMEGLUMINE 529 MG/ML IV SOLN  Comparison: Brain MRI 04/17/2013. [facility] Head and neck MRA 09/14/2007.  MRA NECK WITHOUT AND WITH CONTRAST  Technique:  Angiographic images of the neck were obtained using MRA technique without and with intravenous contrast.  Carotid stenosis measurements (when applicable) are obtained utilizing NASCET criteria, using the distal internal carotid diameter as the denominator.  Findings:  Precontrast time-of-flight imaging.  Antegrade flow in both carotid and vertebral arteries in the neck; the right vertebral artery is diminutive as before.  Stable time-of-flight appearance of the carotid bifurcations.  Postcontrast images with a bovine arch configuration re-identified. Stable and negative great vessel origins.  Stable and negative right common carotid artery, right carotid bifurcation, and cervical right ICA.  Stable and negative left CCA, left carotid bifurcation, and cervical left ICA.  Both vertebral artery origins are within normal limits, the left vertebral artery is dominant.  No vertebral artery stenosis in the neck.  Intracranial findings are below.  IMPRESSION: 1.  Stable and negative neck MRA.  Dominant left vertebral artery and bovine arch configuration incidentally noted. 2.  Intracranial findings are below.  MRA HEAD WITHOUT CONTRAST  Technique: Angiographic images of the Circle of Willis were obtained using MRA technique without  intravenous contrast.  Findings:  Dominant distal left vertebral artery is antegrade flow as before.  Less antegrade flow signal in the nondominant right vertebral artery, which appears to functionally terminating PICA as before.  Still, the contrast MRA appearance of the vessel was stable (above).  Patent basilar artery and AICA origins.  No basilar stenosis.  SCA and PCA origins are stable within normal limits.  Posterior communicating arteries  are diminutive or absent.  Bilateral PCA branches are stable and within normal limits.  Antegrade flow in both ICA siphons.  No ICA stenosis.  Stable carotid termini, the right may be fenestrated.  Ophthalmic artery origins are within normal limits.  Stable MCA and ACA origins, the left ACA A1 segment is dominant.  The anterior communicating artery appears fenestrated.  ACA branches are stable and within normal limits allowing for some pulsation artifact.  Normal MCA origins. Visualized bilateral MCA branchesare stable and within normal limits.  IMPRESSION: Stable and negative intracranial MRA compared to 09/14/2007.   Original Report Authenticated By: Erskine Speed, M.D.   Mr Angiogram Neck W Wo Contrast  04/17/2013   *RADIOLOGY REPORT*  Clinical Data:  52 year old male with vertigo, weakness, headache.  Contrast:  17mL MULTIHANCE GADOBENATE DIMEGLUMINE 529 MG/ML IV SOLN  Comparison: Brain MRI 04/17/2013. [facility] Head and neck MRA 09/14/2007.  MRA NECK WITHOUT AND WITH CONTRAST  Technique:  Angiographic images of the neck were obtained using MRA technique without and with intravenous contrast.  Carotid stenosis measurements (when applicable) are obtained utilizing NASCET criteria, using the distal internal carotid diameter as the denominator.  Findings:  Precontrast time-of-flight imaging.  Antegrade flow in both carotid and vertebral arteries in the neck; the right vertebral artery is diminutive as before.  Stable time-of-flight appearance of the carotid bifurcations.  Postcontrast images with a bovine arch configuration re-identified. Stable and negative great vessel origins.  Stable and negative right common carotid artery, right carotid bifurcation, and cervical right ICA.  Stable and negative left CCA, left carotid bifurcation, and cervical left ICA.  Both vertebral artery origins are within normal limits, the left vertebral artery is dominant.  No vertebral artery stenosis in the neck.   Intracranial findings are below.  IMPRESSION: 1.  Stable and negative neck MRA.  Dominant left vertebral artery and bovine arch configuration incidentally noted. 2.  Intracranial findings are below.  MRA HEAD WITHOUT CONTRAST  Technique: Angiographic images of the Circle of Willis were obtained using MRA technique without  intravenous contrast.  Findings:  Dominant distal left vertebral artery is antegrade flow as before.  Less antegrade flow signal in the nondominant right vertebral artery, which appears to functionally terminating PICA as before.  Still, the contrast MRA appearance of the vessel was stable (above).  Patent basilar artery and AICA origins.  No basilar stenosis.  SCA and PCA origins are stable within normal limits.  Posterior communicating arteries are diminutive or absent.  Bilateral PCA branches are stable and within normal limits.  Antegrade flow in both ICA siphons.  No ICA stenosis.  Stable carotid termini, the right may be fenestrated.  Ophthalmic artery origins are within normal limits.  Stable MCA and ACA origins, the left ACA A1 segment is dominant.  The anterior communicating artery appears fenestrated.  ACA branches are stable and within normal limits allowing for some pulsation artifact.  Normal MCA origins. Visualized bilateral MCA branchesare stable and within normal limits.  IMPRESSION: Stable and negative intracranial MRA compared to 09/14/2007.   Original Report Authenticated By: Erskine Speed, M.D.   Medications: I have reviewed the patient's current medications. Scheduled Meds: . aspirin EC  81 mg Oral Daily  . enoxaparin (LOVENOX) injection  40 mg Subcutaneous Q24H  . sodium chloride  3 mL Intravenous Q12H  . traMADol  50 mg Oral Once   Continuous Infusions:  PRN Meds:.acetaminophen, acetaminophen, ondansetron (ZOFRAN) IV, ondansetron, traMADol Assessment/Plan:   Ivan Murphy is a 11 yr AA male with a PMH of HTN and hyperlipidemia, who presents with acute onset of  positional dizziness with accompanying headache and generalized weakness for 1 day.   #Fever with headache and generalized weakness   Patient has been afebrile for over 24 hours and reports resolution of headache and generalized weakness. Constellation of symptoms, negative blood cultures and resolution of symptoms in the absence of antibiotic treatment are suggestive of viral etiology.  Other likely source of infection is bug bite on right forearm that was debrided on admission. The bite was mildly erythematous, edematous on admission but without purulent drainage, and has since resolved. Atypical meningitis ruled out due to clinical presentation and resolution of neurological symptoms, absence of neck/back stiffness, and normal CBC . UTI ruled out as  the patient is asymptomatic with a negative UA and urine cultures. Pneumonia is ruled as the patient is asymptomatic, denies shortness of breath, cough, and sputum production, and CXR showed no acute process.  - Discharge today  #Dizziness   Patient reports resolution of positional dizziness this morning. Dizziness was most likely due to orthostatic hypotension (133/74 lying, 114/79 standing) from persistent fever, and reported decreased PO intake and GI losses from intermittent diarrhea x2days. Clinical improvement and resolution of orthostatic hypotension with administration of IV fluids overnight further support this diagnosis. Presentation was also concerning for benign paroxysmal positional vertigo, however resolution of symptoms without vestibulocochlear manipulation make this diagnosis less likely. Additionally meclazine and lorazepam, administered on admission, failed to resolve symptoms. Finally, PT conducted a vestibular evaluation yesterday and definitively ruled out BPPV as cause of symptoms. Ischemic stroke was ruled out based on stable and negative MRA head and neck and carotid doppler. Although, MRI indicated questionable infarct in the right  lateral medulla, as well as abnormal appearance of the right vertebral artery, there is no clear source of ischemia. Additionally, there is no other focal neurological or vestibulocochlear deficits which would be expected in a lateral medullary infarct (Wallenberg syndrome).  - Patient was educated on the importance of maintaining hydration to avoid future symptoms  # Insect bite  Patient presented with a painful erythematous, edematous insect bite on his right forearm. The site was debrided in the ED. Currently protected with just a band aid. No overlying cellulitis, erythema or edema is noted. Possible it was the source of infection responsible for fever.   #Hyperlipidemia  Most recent lipid panel shows improved hyperlipidemia with a total cholesterol of 152, LDL 92, HDL 47, TG 67. ASCVD risk calculated at 5.8% (non smoker) and 8.2% (smoker). -Continue simvistatin 40mg  at home  - follow up with PCP in two weeks  #Hypertension  History of hypertension documented, but not medically controlled. Patient remains normotensive with systolic pressures in the 120-140s.   #History of ETOH/ tobacco and cocaine abuse  Patient lives at [facility] house, a drug and alcohol rehabilitation center. Reports being sober for several months. UDS came back negative.  This is a Psychologist, occupational Note.  The care of the patient was discussed with Dr. Aundria Rud and the assessment and plan formulated with their assistance.  Please see their attached note for official documentation of the daily encounter.   LOS: 2 days   Everardo All, Med Student 04/19/2013, 11:06 AM

## 2013-04-19 NOTE — Progress Notes (Signed)
Subjective: Ivan Murphy is feeling much better this morning, no headache, no dizziness, no weakness.  Pt eating well.   Of note, pt has been afebrile for last 24 hours.   Objective: Vital signs in last 24 hours: Filed Vitals:   04/18/13 0517 04/18/13 1254 04/18/13 2159 04/19/13 0558  BP: 120/68 124/82 130/79 127/79  Pulse: 101 74 69 55  Temp: 101.8 F (38.8 C) 99.2 F (37.3 C) 98.4 F (36.9 C) 98.5 F (36.9 C)  TempSrc: Oral Oral Oral Oral  Resp: 16 18 20 18   Height:      Weight:      SpO2: 95% 94% 94% 93%   Weight change:   Intake/Output Summary (Last 24 hours) at 04/19/13 0819 Last data filed at 04/19/13 0653  Gross per 24 hour  Intake   1200 ml  Output   1200 ml  Net      0 ml   PEX General: alert, cooperative, and in no apparent distress HEENT: vision grossly intact, oropharynx clear and non-erythematous  Neck: supple, no lymphadenopathy, JVD, or carotid bruits Lungs: clear to ascultation bilaterally, normal work of respiration, no wheezes, rales, ronchi Heart: regular rate and rhythm, no murmurs, gallops, or rubs Abdomen: soft, non-tender, non-distended, normal bowel sounds Extremities: right arm with small abscess s/p I&D, no erythema, edema, drainage; no cyanosis, clubbing, or edema Neurologic: alert & oriented X3, cranial nerves II-XII intact, strength grossly intact, sensation intact to light touch  Lab Results: Basic Metabolic Panel:  Recent Labs Lab 04/17/13 0905  NA 133*  K 4.2  CL 99  CO2 23  GLUCOSE 107*  BUN 10  CREATININE 0.92  CALCIUM 9.1   CBC:  Recent Labs Lab 04/17/13 0905  WBC 7.2  NEUTROABS 5.2  HGB 13.5  HCT 38.4*  MCV 79.2  PLT 182   Cardiac Enzymes:  Recent Labs Lab 04/17/13 1321  TROPONINI <0.30   Fasting Lipid Panel:  Recent Labs Lab 04/18/13 0600  CHOL 152  HDL 47  LDLCALC 92  TRIG 67  CHOLHDL 3.2   Urine Drug Screen: Drugs of Abuse     Component Value Date/Time   LABOPIA NONE DETECTED 04/18/2013  0733   COCAINSCRNUR NONE DETECTED 04/18/2013 0733   LABBENZ NONE DETECTED 04/18/2013 0733   AMPHETMU NONE DETECTED 04/18/2013 0733   THCU NONE DETECTED 04/18/2013 0733   LABBARB NONE DETECTED 04/18/2013 0733    Urinalysis:  Recent Labs Lab 04/18/13 0733  COLORURINE YELLOW  LABSPEC 1.020  PHURINE 5.5  GLUCOSEU NEGATIVE  HGBUR NEGATIVE  BILIRUBINUR NEGATIVE  KETONESUR NEGATIVE  PROTEINUR NEGATIVE  UROBILINOGEN 0.2  NITRITE NEGATIVE  LEUKOCYTESUR NEGATIVE   Studies/Results: Dg Chest 2 View  04/18/2013   *RADIOLOGY REPORT*  Clinical Data: Pneumonia.  CHEST - 2 VIEW  Comparison: 04/18/2013  Findings: Improved aeration of the lungs. Heart and mediastinal contours are within normal limits.  No focal opacities or effusions.  No acute bony abnormality.  IMPRESSION: Normal study.   Original Report Authenticated By: Charlett Nose, M.D.   Dg Chest 2 View  04/18/2013   *RADIOLOGY REPORT*  Clinical Data: Fever  CHEST - 2 VIEW  Comparison: 02/27/2013  Findings: The heart size and pulmonary vascularity are normal and the lungs are clear.  No effusions.  No acute osseous abnormality.  IMPRESSION: No acute disease.   Original Report Authenticated By: Francene Boyers, M.D.   [name] Wo Contrast  04/17/2013   *RADIOLOGY REPORT*  Clinical Data:  52 year old male with vertigo,  weakness, headache.  Contrast: 17mL MULTIHANCE GADOBENATE DIMEGLUMINE 529 MG/ML IV SOLN  Comparison: Brain MRI 04/17/2013. [facility] Head and neck MRA 09/14/2007.  MRA NECK WITHOUT AND WITH CONTRAST  Technique:  Angiographic images of the neck were obtained using MRA technique without and with intravenous contrast.  Carotid stenosis measurements (when applicable) are obtained utilizing NASCET criteria, using the distal internal carotid diameter as the denominator.  Findings:  Precontrast time-of-flight imaging.  Antegrade flow in both carotid and vertebral arteries in the neck; the right vertebral artery is diminutive as before.   Stable time-of-flight appearance of the carotid bifurcations.  Postcontrast images with a bovine arch configuration re-identified. Stable and negative great vessel origins.  Stable and negative right common carotid artery, right carotid bifurcation, and cervical right ICA.  Stable and negative left CCA, left carotid bifurcation, and cervical left ICA.  Both vertebral artery origins are within normal limits, the left vertebral artery is dominant.  No vertebral artery stenosis in the neck.  Intracranial findings are below.  IMPRESSION: 1.  Stable and negative neck MRA.  Dominant left vertebral artery and bovine arch configuration incidentally noted. 2.  Intracranial findings are below.  MRA HEAD WITHOUT CONTRAST  Technique: Angiographic images of the Circle of Willis were obtained using MRA technique without  intravenous contrast.  Findings:  Dominant distal left vertebral artery is antegrade flow as before.  Less antegrade flow signal in the nondominant right vertebral artery, which appears to functionally terminating PICA as before.  Still, the contrast MRA appearance of the vessel was stable (above).  Patent basilar artery and AICA origins.  No basilar stenosis.  SCA and PCA origins are stable within normal limits.  Posterior communicating arteries are diminutive or absent.  Bilateral PCA branches are stable and within normal limits.  Antegrade flow in both ICA siphons.  No ICA stenosis.  Stable carotid termini, the right may be fenestrated.  Ophthalmic artery origins are within normal limits.  Stable MCA and ACA origins, the left ACA A1 segment is dominant.  The anterior communicating artery appears fenestrated.  ACA branches are stable and within normal limits allowing for some pulsation artifact.  Normal MCA origins. Visualized bilateral MCA branchesare stable and within normal limits.  IMPRESSION: Stable and negative intracranial MRA compared to 09/14/2007.   Original Report Authenticated By: Erskine Speed, M.D.    Mr Angiogram Neck W Wo Contrast  04/17/2013   *RADIOLOGY REPORT*  Clinical Data:  52 year old male with vertigo, weakness, headache.  Contrast: 17mL MULTIHANCE GADOBENATE DIMEGLUMINE 529 MG/ML IV SOLN  Comparison: Brain MRI 04/17/2013. [facility] Head and neck MRA 09/14/2007.  MRA NECK WITHOUT AND WITH CONTRAST  Technique:  Angiographic images of the neck were obtained using MRA technique without and with intravenous contrast.  Carotid stenosis measurements (when applicable) are obtained utilizing NASCET criteria, using the distal internal carotid diameter as the denominator.  Findings:  Precontrast time-of-flight imaging.  Antegrade flow in both carotid and vertebral arteries in the neck; the right vertebral artery is diminutive as before.  Stable time-of-flight appearance of the carotid bifurcations.  Postcontrast images with a bovine arch configuration re-identified. Stable and negative great vessel origins.  Stable and negative right common carotid artery, right carotid bifurcation, and cervical right ICA.  Stable and negative left CCA, left carotid bifurcation, and cervical left ICA.  Both vertebral artery origins are within normal limits, the left vertebral artery is dominant.  No vertebral artery stenosis in the neck.  Intracranial findings are  below.  IMPRESSION: 1.  Stable and negative neck MRA.  Dominant left vertebral artery and bovine arch configuration incidentally noted. 2.  Intracranial findings are below.  MRA HEAD WITHOUT CONTRAST  Technique: Angiographic images of the Circle of Willis were obtained using MRA technique without  intravenous contrast.  Findings:  Dominant distal left vertebral artery is antegrade flow as before.  Less antegrade flow signal in the nondominant right vertebral artery, which appears to functionally terminating PICA as before.  Still, the contrast MRA appearance of the vessel was stable (above).  Patent basilar artery and AICA origins.  No basilar stenosis.   SCA and PCA origins are stable within normal limits.  Posterior communicating arteries are diminutive or absent.  Bilateral PCA branches are stable and within normal limits.  Antegrade flow in both ICA siphons.  No ICA stenosis.  Stable carotid termini, the right may be fenestrated.  Ophthalmic artery origins are within normal limits.  Stable MCA and ACA origins, the left ACA A1 segment is dominant.  The anterior communicating artery appears fenestrated.  ACA branches are stable and within normal limits allowing for some pulsation artifact.  Normal MCA origins. Visualized bilateral MCA branchesare stable and within normal limits.  IMPRESSION: Stable and negative intracranial MRA compared to 09/14/2007.   Original Report Authenticated By: Erskine Speed, M.D.   Mr Brain Wo Contrast  04/17/2013   *RADIOLOGY REPORT*  Clinical Data: Bug bite.  Dizziness when turning head fast. Hypertension.  MRI HEAD WITHOUT CONTRAST  Technique:  Multiplanar, multiecho pulse sequences of the brain and surrounding structures were obtained according to standard protocol without intravenous contrast.  Comparison: 09/14/2007.  Findings: Motion degraded exam.  Minimal increased signal right lateral medulla (series 4 image 7), this may be related to artifact however, given the patient's history of dizziness and high blood pressure, a tiny infarct is difficult to completely excluded.  Otherwise no evidence of acute infarct.  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  No intracranial hemorrhage.  No hydrocephalus.  No intracranial mass lesion detected on this unenhanced exam.  Minimal nonspecific white matter hyperintensity anterior left periopercular region may be related to small vessel disease or migraine headaches.  Mild spinal stenosis C3-4.  Cervical medullary junction, pituitary region, pineal region and orbital structures unremarkable.   Minimal paranasal sinus mucosal thickening.  IMPRESSION: Motion degraded exam.  Minimal increased signal right lateral medulla (series 4 image 7), this may be related to artifact however, given the patient's history of dizziness and high blood pressure, a tiny infarct is difficult to excluded. Additionally, there is an abnormal appearance of the right vertebral artery which although congenitally small also appears narrowed or occluded.   Original Report Authenticated By: Lacy Duverney, M.D.   Medications: I have reviewed the patient's current medications. Scheduled Meds: . aspirin EC  81 mg Oral Daily  . enoxaparin (LOVENOX) injection  40 mg Subcutaneous Q24H  . sodium chloride  3 mL Intravenous Q12H  . traMADol  50 mg Oral Once   Continuous Infusions: . sodium chloride 75 mL/hr at 04/19/13 0653   PRN Meds:.acetaminophen, acetaminophen, ondansetron (ZOFRAN) IV, ondansetron, traMADol Assessment/Plan: Mr. Damyn is a 52 y/o man with history of HL on simvastatin who was admitted for stroke workup due to chief complaint of dizziness in setting of MRI with minimal increased signal in right lateral medulla vs. artifact, abnormal appearance of right vertebral artery. However, pt had subsequent negative  head and neck MRA.  Pt had fever to 102.8 night of 7/22, has been afebrile for past 24 hours.   #Fever, resolved- Pt spiked a fever to 102.8 evening of 7/22, afebrile for last 24 hours. Etiology unclear, likely due to viral infection. UA negative, CXR negative, blood cx x 2 NGTD. Orthostatics negative today.  -d/c IVF  #Dizziness, resolved- Likely secondary to volume depletion, positive orthostatics yesterday, now resolved after IVF.  Pt with dizziness only that is now resolved, normal neuro exam.  MRI showed possible infarction in right lateral medulla and abnormal appearance of right vertebral artery.  MRA showed stable and negative intracranial and neck MRA therefore CVA very unlikely.  Carotid dopplers  with 39% stenosis bilaterally.  Pt had ?positive Dix Hallpike maneuver assessing for BPPV- symptoms were reproducible but no nystagmus noted.  BPPV relatively unlikely given that pt did not respond to meclazine in ED.  EKG unchanged from prior.  UDS negative. Orthostatics negative today.  -PT/OT vestibular eval recommended no PT follow-up  # HL- Pt had FLP on 04/04/13 showing LDL 107 on simvistatin 40mg  at home. Repeat FLP on 7/23 with LDL of 92. ASCVD risk 8.5% (counting pt as smoker given that he just quit in May).  -continue statin  # Insect bite- Pt reports sleeping with the windows open one night this week. Woke up with a painful insect bite on his right arm. S/p I&D in ED on 7/22 with purulent drainage. This may be possible source of infection though not particularly concerning given lack of erythema or current discharge.    # History of tobacco, EtOH, and cocaine abuse- Pt lives in a drug and alcohol rehab center. Has been sober for several months now.   # DVT PPX- lovenox   Disposition tomorrow.   The patient does have a current PCP (No Pcp Per Patient) and does need an [facility] hospital follow-up appointment after discharge. See below  Appt on Aug 6,2014 at 1100 for [facility] at [facility].    .Services Needed at time of discharge: Y = Yes, Blank = No PT:   OT:   RN:   Equipment:   Other:     LOS: 2 days   Rocco Serene, MD 04/19/2013, 8:19 AM

## 2013-04-24 LAB — CULTURE, BLOOD (ROUTINE X 2): Culture: NO GROWTH

## 2013-10-04 ENCOUNTER — Emergency Department (HOSPITAL_COMMUNITY)
Admission: EM | Admit: 2013-10-04 | Discharge: 2013-10-05 | Disposition: A | Discharge status: Home / Self Care | Payer: Self-pay | Attending: Emergency Medicine | Admitting: Emergency Medicine

## 2013-10-04 ENCOUNTER — Emergency Department (HOSPITAL_COMMUNITY): Payer: Self-pay

## 2013-10-04 ENCOUNTER — Encounter (HOSPITAL_COMMUNITY): Payer: Self-pay | Admitting: Emergency Medicine

## 2013-10-04 ENCOUNTER — Other Ambulatory Visit: Payer: Self-pay

## 2013-10-04 DIAGNOSIS — H8110 Benign paroxysmal vertigo, unspecified ear: Secondary | ICD-10-CM

## 2013-10-04 DIAGNOSIS — Z87891 Personal history of nicotine dependence: Secondary | ICD-10-CM | POA: Insufficient documentation

## 2013-10-04 DIAGNOSIS — J111 Influenza due to unidentified influenza virus with other respiratory manifestations: Secondary | ICD-10-CM

## 2013-10-04 DIAGNOSIS — R0982 Postnasal drip: Secondary | ICD-10-CM | POA: Insufficient documentation

## 2013-10-04 DIAGNOSIS — R69 Illness, unspecified: Secondary | ICD-10-CM

## 2013-10-04 DIAGNOSIS — R110 Nausea: Secondary | ICD-10-CM | POA: Insufficient documentation

## 2013-10-04 DIAGNOSIS — R42 Dizziness and giddiness: Secondary | ICD-10-CM

## 2013-10-04 DIAGNOSIS — I10 Essential (primary) hypertension: Secondary | ICD-10-CM | POA: Insufficient documentation

## 2013-10-04 LAB — CBC
HEMATOCRIT: 41.2 % (ref 39.0–52.0)
Hemoglobin: 14.2 g/dL (ref 13.0–17.0)
MCH: 27.6 pg (ref 26.0–34.0)
MCHC: 34.5 g/dL (ref 30.0–36.0)
MCV: 80.2 fL (ref 78.0–100.0)
PLATELETS: 161 10*3/uL (ref 150–400)
RBC: 5.14 MIL/uL (ref 4.22–5.81)
RDW: 14.2 % (ref 11.5–15.5)
WBC: 6.7 10*3/uL (ref 4.0–10.5)

## 2013-10-04 LAB — COMPREHENSIVE METABOLIC PANEL
ALK PHOS: 51 U/L (ref 39–117)
ALT: 28 U/L (ref 0–53)
AST: 35 U/L (ref 0–37)
Albumin: 3.9 g/dL (ref 3.5–5.2)
BILIRUBIN TOTAL: 0.3 mg/dL (ref 0.3–1.2)
BUN: 13 mg/dL (ref 6–23)
CHLORIDE: 105 meq/L (ref 96–112)
CO2: 23 meq/L (ref 19–32)
CREATININE: 0.82 mg/dL (ref 0.50–1.35)
Calcium: 8.2 mg/dL — ABNORMAL LOW (ref 8.4–10.5)
GFR calc Af Amer: >90 mL/min (ref >90)
Glucose, Bld: 85 mg/dL (ref 70–99)
POTASSIUM: 4.4 meq/L (ref 3.7–5.3)
Sodium: 143 mEq/L (ref 137–147)
Total Protein: 7.7 g/dL (ref 6.0–8.3)

## 2013-10-04 MED ORDER — MECLIZINE HCL 25 MG PO TABS
25.0000 mg | ORAL_TABLET | Freq: Once | ORAL | Status: AC
  Administered 2013-10-04: 25 mg via ORAL
  Filled 2013-10-04: qty 1

## 2013-10-04 NOTE — ED Notes (Signed)
Pt c/o severe headache, and head is "swimmy" Pt has not ate anything since lunch

## 2013-10-04 NOTE — ED Provider Notes (Signed)
CSN: [id number]     Arrival date & time 10/04/13  1853 History   First MD Initiated Contact with Patient 10/04/13 2150     Chief Complaint  Patient presents with  . Dizziness   (Consider location/radiation/quality/duration/timing/severity/associated sxs/prior Treatment) The history is provided by the patient and medical records.    Ivan Murphy is a 53 y.o. male  with a hx of HTN presents to the Emergency Department complaining of gradual, persistent, progressively worsening dizziness onset yesterday evening.  Pt reports sitting, standing, walking all makes his symptoms.  Pt also reports his cough makes it worse and closing his eyes makes it worse.  Associated symptoms include nausea, chills, headache, sore throat, rhinorrhea, nasal congestion, myalgias.  Pt reports frontal headache, throbbing.  Pt reports cough beginning last night with the dizziness.  Nothing makes it better.  Pt denies sick contacts.  Pt denies fever, neck pain, neck stiffness, chest pain, abdominal pain, vomiting, diarrhea, syncope, dysuria.   Past Medical History  Diagnosis Date  . Chest pain at rest 02/26/2013  . Headache(784.0)   . S/P cardiac cath, 02/28/13 02/28/2013  . Abnormal EKG, felt to be due to hypertension  02/28/2013  . Hypertension    Past Surgical History  Procedure Laterality Date  . Irrigation and debridement abscess    . Cardiac catheterization  02/28/2013   Family History  Problem Relation Age of Onset  . Diabetes Brother    History  Substance Use Topics  . Smoking status: Former Smoker -- 1.00 packs/day for 39 years    Types: Cigarettes    Quit date: 02/12/2013  . Smokeless tobacco: Never Used  . Alcohol Use: No     Comment: history of cocaine and marijuana.  clean since 02/12/13.      Review of Systems  Constitutional: Negative for fever, chills, diaphoresis, appetite change, fatigue and unexpected weight change.  HENT: Positive for congestion, postnasal drip, rhinorrhea, sinus pressure and  sore throat. Negative for ear discharge, ear pain and mouth sores.   Eyes: Negative for visual disturbance.  Respiratory: Positive for cough. Negative for chest tightness, shortness of breath, wheezing and stridor.   Cardiovascular: Negative for chest pain, palpitations and leg swelling.  Gastrointestinal: Positive for nausea. Negative for vomiting, abdominal pain, diarrhea and constipation.  Endocrine: Negative for polydipsia, polyphagia and polyuria.  Genitourinary: Negative for dysuria, urgency, frequency and hematuria.  Musculoskeletal: Negative for arthralgias, back pain, myalgias and neck stiffness.  Skin: Negative for rash.  Allergic/Immunologic: Negative for immunocompromised state.  Neurological: Positive for dizziness and headaches. Negative for syncope, weakness, light-headedness and numbness.  Hematological: Negative for adenopathy. Does not bruise/bleed easily.  Psychiatric/Behavioral: Negative for sleep disturbance. The patient is not nervous/anxious.   All other systems reviewed and are negative.    Allergies  Review of patient's allergies indicates no known allergies.  Home Medications   Current Outpatient Rx  Name  Route  Sig  Dispense  Refill  . naproxen sodium (ANAPROX) 220 MG tablet   Oral   Take 220 mg by mouth 2 (two) times daily as needed (for pain).          . meclizine (ANTIVERT) 25 MG tablet   Oral   Take 1 tablet (25 mg total) by mouth 4 (four) times daily as needed for dizziness.   28 tablet   0   . oseltamivir (TAMIFLU) 75 MG capsule   Oral   Take 1 capsule (75 mg total) by mouth every 12 (twelve) hours.  10 capsule   0   . oxyCODONE (ROXICODONE) 5 MG/5ML solution   Oral   Take 5 mLs (5 mg total) by mouth every 4 (four) hours as needed for severe pain.   50 mL   0    BP 140/82  Pulse 90  Temp(Src) 99.4 F (37.4 C)  Resp 22  Ht 5\' 6"  (1.676 m)  Wt 220 lb (99.791 kg)  BMI 35.53 kg/m2  SpO2 100% Physical Exam  Nursing note and  vitals reviewed. Constitutional: He is oriented to person, place, and time. He appears well-developed and well-nourished. No distress.  HENT:  Head: Normocephalic and atraumatic.  Right Ear: Tympanic membrane, external ear and ear canal normal.  Left Ear: Tympanic membrane, external ear and ear canal normal.  Nose: Mucosal edema and rhinorrhea present. No epistaxis. Right sinus exhibits no maxillary sinus tenderness and no frontal sinus tenderness. Left sinus exhibits no maxillary sinus tenderness and no frontal sinus tenderness.  Mouth/Throat: Uvula is midline, oropharynx is clear and moist and mucous membranes are normal. Mucous membranes are not pale and not cyanotic. No oropharyngeal exudate, posterior oropharyngeal edema, posterior oropharyngeal erythema or tonsillar abscesses.  Eyes: Conjunctivae are normal. Pupils are equal, round, and reactive to light. No scleral icterus. Right eye exhibits nystagmus. Left eye exhibits nystagmus.  Bilateral horizontal nystagmus for approximately 2 beats  Neck: Normal range of motion and full passive range of motion without pain. Neck supple. No spinous process tenderness and no muscular tenderness present. No rigidity. Normal range of motion present. No Brudzinski's sign and no Kernig's sign noted.  Full range of motion without pain  Cardiovascular: Normal rate, regular rhythm, normal heart sounds and intact distal pulses.   No murmur heard. Pulmonary/Chest: Effort normal and breath sounds normal. No stridor. No respiratory distress. He has no wheezes. He has no rales.  Abdominal: Soft. Bowel sounds are normal. He exhibits no distension. There is no tenderness. There is no rebound and no guarding.  Musculoskeletal: Normal range of motion.  Lymphadenopathy:    He has no cervical adenopathy.  Neurological: He is alert and oriented to person, place, and time. He has normal reflexes. No cranial nerve deficit. He exhibits normal muscle tone. Coordination  normal.  Speech is clear and goal oriented, follows commands Cranial nerves III - XII without deficit, no facial droop Normal strength in upper and lower extremities bilaterally, strong and equal grip strength Sensation normal to light and sharp touch Moves extremities without ataxia, coordination intact Normal finger to nose and rapid alternating movements No pronator drift Unsteady gait Pt too unbalanced for heel-shin in a standing position, but able to perform without difficulty while lying   Skin: Skin is warm and dry. No rash noted. He is not diaphoretic. No erythema.  Psychiatric: He has a normal mood and affect. His behavior is normal. Judgment and thought content normal.    ED Course  Procedures (including critical care time) Labs Review Labs Reviewed  COMPREHENSIVE METABOLIC PANEL - Abnormal; Notable for the following:    Calcium 8.2 (*)    All other components within normal limits  CBC   Imaging Review Dg Chest 2 View  10/04/2013   CLINICAL DATA:  Dizziness and cough  EXAM: CHEST  2 VIEW  COMPARISON:  April 18, 2013  FINDINGS: There is a probable nipple shadow at the left base. Elsewhere lungs are clear. Heart size and pulmonary vascularity are normal. No adenopathy. No bone lesions. There is mild upper thoracic levoscoliosis.  IMPRESSION: Probable nipple shadow left base. Repeat study with nipple markers advised to further assess. Lungs elsewhere clear.   Electronically Signed   By: Bretta Bang M.D.   On: 10/04/2013 19:46   Ct Head Wo Contrast  10/04/2013   CLINICAL DATA:  Dizziness, nausea, headache  EXAM: CT HEAD WITHOUT CONTRAST  TECHNIQUE: Contiguous axial images were obtained from the base of the skull through the vertex without intravenous contrast.  COMPARISON:  Prior MRI from 04/17/2013  FINDINGS: There is no acute intracranial hemorrhage or infarct. No mass lesion or midline shift. Gray-white matter differentiation is well maintained. Ventricles are normal in size  without evidence of hydrocephalus. CSF containing spaces are within normal limits. No extra-axial fluid collection.  The calvarium is intact.  Orbital soft tissues are within normal limits.  The paranasal sinuses and mastoid air cells are well pneumatized and free of fluid.  Scalp soft tissues are unremarkable.  IMPRESSION: No acute intracranial abnormality.   Electronically Signed   By: Rise Mu M.D.   On: 10/04/2013 22:50    EKG Interpretation   None       MDM   1. BPPV (benign paroxysmal positional vertigo)   2. Dizziness   3. Influenza-like illness     ROONEY SWAILS presents with headache, dizziness and horizontal nystagmus on exam. Patient without history of BPPV but history and physical consistent with the same today.  Patient also complaining of coughing and nausea.  Will obtain CT scan to rule out head bleed and treat with Antivert and reassess.  Neurologically intact without focal deficit. Hemodynamically stable.  12:00AM   Patient with improved dizziness a persistent headache. We'll give hydrocodone.  He ambulates in hall with better gait.  CT scan unremarkable for acute findings; chest x-ray without evidence of pneumonia.  I personally reviewed the imaging tests through PACS system  I reviewed available ER/hospitalization records through the EMR  12:46 AM Pt with continued improved steadiness of gait with improved symptoms and decreased dizziness, but continues to c/o mild headache.  Will give toradol and reassess.  Repeat neurologic exam shows no focal deficits.  1:44 AM Patient reports improved headache and completely resolved dizziness. He continues to complain of sore throat, general myalgias and general illness. Patient with influenza-like illness. He is non-tachycardic, without hypotension. No evidence of CVA on exam or CT.  Patient remains without neurologic deficit and symptoms resolved with meclizine. We'll discharge home with Tamiflu to and pain control for  flulike symptoms and Antivert for BPPV. Recommend followup with PCP or the condylomatous clinic. Patient given strict instructions to return to the emergency department for new or worsening symptoms.  It has been determined that no acute conditions requiring further emergency intervention are present at this time. The patient/guardian have been advised of the diagnosis and plan. We have discussed signs and symptoms that warrant return to the ED, such as changes or worsening in symptoms.   Vital signs are stable at discharge.   BP 140/82  Pulse 90  Temp(Src) 99.4 F (37.4 C)  Resp 22  Ht 5\' 6"  (1.676 m)  Wt 220 lb (99.791 kg)  BMI 35.53 kg/m2  SpO2 100%  Patient/guardian has voiced understanding and agreed to follow-up with the PCP or specialist.       Dierdre Forth, PA-C 10/05/13 [phone]

## 2013-10-04 NOTE — ED Notes (Signed)
Dizziness all day.  Coughing .  Nausea no vomiting

## 2013-10-05 MED ORDER — HYDROCODONE-ACETAMINOPHEN 5-325 MG PO TABS
2.0000 | ORAL_TABLET | Freq: Once | ORAL | Status: AC
  Administered 2013-10-05: 2 via ORAL
  Filled 2013-10-05: qty 2

## 2013-10-05 MED ORDER — OSELTAMIVIR PHOSPHATE 75 MG PO CAPS: 75.0000 mg | ORAL_CAPSULE | Freq: Two times a day (BID) | ORAL | Status: DC

## 2013-10-05 MED ORDER — KETOROLAC TROMETHAMINE 30 MG/ML IJ SOLN: 30.0000 mg | Freq: Once | INTRAMUSCULAR | Status: DC

## 2013-10-05 MED ORDER — KETOROLAC TROMETHAMINE 60 MG/2ML IM SOLN: 60.0000 mg | Freq: Once | INTRAMUSCULAR | Status: DC

## 2013-10-05 MED ORDER — OXYCODONE HCL 5 MG/5ML PO SOLN: 5.0000 mg | ORAL | Status: DC | PRN

## 2013-10-05 MED ORDER — MECLIZINE HCL 25 MG PO TABS: 25.0000 mg | ORAL_TABLET | Freq: Four times a day (QID) | ORAL | Status: DC | PRN

## 2013-10-05 NOTE — Discharge Instructions (Signed)
1. Medications: antivert for dizziness, usual home medications 2. Treatment: rest, drink plenty of fluids,  3. Follow Up: Please followup with your primary doctor for discussion of your diagnoses and further evaluation after today's visit; if you do not have a primary care doctor use the resource guide provided to find one;     Benign Positional Vertigo Vertigo means you feel like you or your surroundings are moving when they are not. Benign positional vertigo is the most common form of vertigo. Benign means that the cause of your condition is not serious. Benign positional vertigo is more common in older adults. CAUSES  Benign positional vertigo is the result of an upset in the labyrinth system. This is an area in the middle ear that helps control your balance. This may be caused by a viral infection, head injury, or repetitive motion. However, often no specific cause is found. SYMPTOMS  Symptoms of benign positional vertigo occur when you move your head or eyes in different directions. Some of the symptoms may include:  Loss of balance and falls.  Vomiting.  Blurred vision.  Dizziness.  Nausea.  Involuntary eye movements (nystagmus). DIAGNOSIS  Benign positional vertigo is usually diagnosed by physical exam. If the specific cause of your benign positional vertigo is unknown, your caregiver may perform imaging tests, such as magnetic resonance imaging (MRI) or computed tomography (CT). TREATMENT  Your caregiver may recommend movements or procedures to correct the benign positional vertigo. Medicines such as meclizine, benzodiazepines, and medicines for nausea may be used to treat your symptoms. In rare cases, if your symptoms are caused by certain conditions that affect the inner ear, you may need surgery. HOME CARE INSTRUCTIONS   Follow your caregiver's instructions.  Move slowly. Do not make sudden body or head movements.  Avoid driving.  Avoid operating heavy  machinery.  Avoid performing any tasks that would be dangerous to you or others during a vertigo episode.  Drink enough fluids to keep your urine clear or pale yellow. SEEK IMMEDIATE MEDICAL CARE IF:   You develop problems with walking, weakness, numbness, or using your arms, hands, or legs.  You have difficulty speaking.  You develop severe headaches.  Your nausea or vomiting continues or gets worse.  You develop visual changes.  Your family or friends notice any behavioral changes.  Your condition gets worse.  You have a fever.  You develop a stiff neck or sensitivity to light. MAKE SURE YOU:   Understand these instructions.  Will watch your condition.  Will get help right away if you are not doing well or get worse. Document Released: 06/21/2006 Document Revised: 12/06/2011 Document Reviewed: 06/03/2011 [facility] Patient Information 2014 [place], [place].  Influenza, Adult Influenza ("the flu") is a viral infection of the respiratory tract. It occurs more often in winter months because people spend more time in close contact with one another. Influenza can make you feel very sick. Influenza easily spreads from person to person (contagious). CAUSES  Influenza is caused by a virus that infects the respiratory tract. You can catch the virus by breathing in droplets from an infected person's cough or sneeze. You can also catch the virus by touching something that was recently contaminated with the virus and then touching your mouth, nose, or eyes. SYMPTOMS  Symptoms typically last 4 to 10 days and may include:  Fever.  Chills.  Headache, body aches, and muscle aches.  Sore throat.  Chest discomfort and cough.  Poor appetite.  Weakness or feeling tired.  Dizziness.  Nausea or vomiting. DIAGNOSIS  Diagnosis of influenza is often made based on your history and a physical exam. A nose or throat swab test can be done to confirm the diagnosis. RISKS AND  COMPLICATIONS You may be at risk for a more severe case of influenza if you smoke cigarettes, have diabetes, have chronic heart disease (such as heart failure) or lung disease (such as asthma), or if you have a weakened immune system. Elderly people and pregnant women are also at risk for more serious infections. The most common complication of influenza is a lung infection (pneumonia). Sometimes, this complication can require emergency medical care and may be life-threatening. PREVENTION  An annual influenza vaccination (flu shot) is the best way to avoid getting influenza. An annual flu shot is now routinely recommended for all adults in the [place] TREATMENT  In mild cases, influenza goes away on its own. Treatment is directed at relieving symptoms. For more severe cases, your caregiver may prescribe antiviral medicines to shorten the sickness. Antibiotic medicines are not effective, because the infection is caused by a virus, not by bacteria. HOME CARE INSTRUCTIONS  Only take over-the-counter or prescription medicines for pain, discomfort, or fever as directed by your caregiver.  Use a cool mist humidifier to make breathing easier.  Get plenty of rest until your temperature returns to normal. This usually takes 3 to 4 days.  Drink enough fluids to keep your urine clear or pale yellow.  Cover your mouth and nose when coughing or sneezing, and wash your hands well to avoid spreading the virus.  Stay home from work or school until your fever has been gone for at least 1 full day. SEEK MEDICAL CARE IF:   You have chest pain or a deep cough that worsens or produces more mucus.  You have nausea, vomiting, or diarrhea. SEEK IMMEDIATE MEDICAL CARE IF:   You have difficulty breathing, shortness of breath, or your skin or nails turn bluish.  You have severe neck pain or stiffness.  You have a severe headache, facial pain, or earache.  You have a worsening or recurring fever.  You have nausea  or vomiting that cannot be controlled. MAKE SURE YOU:  Understand these instructions.  Will watch your condition.  Will get help right away if you are not doing well or get worse. Document Released: 09/10/2000 Document Revised: 03/14/2012 Document Reviewed: 12/13/2011 [facility] Patient Information 2014 [place], [place].   Emergency Department Resource Guide 1) Find a Doctor and Pay Out of Pocket Although you won't have to find out who is covered by your insurance plan, it is a good idea to ask around and get recommendations. You will then need to call the office and see if the doctor you have chosen will accept you as a new patient and what types of options they offer for patients who are self-pay. Some doctors offer discounts or will set up payment plans for their patients who do not have insurance, but you will need to ask so you aren't surprised when you get to your appointment.  2) Contact Your Local Health Department Not all health departments have doctors that can see patients for sick visits, but many do, so it is worth a call to see if yours does. If you don't know where your local health department is, you can check in your phone book. The CDC also has a tool to help you locate your state's health department, and many state websites also have listings of all  of their local health departments.  3) Find a Walk-in Clinic If your illness is not likely to be very severe or complicated, you may want to try a walk in clinic. These are popping up all over the country in pharmacies, drugstores, and shopping centers. They're usually staffed by nurse practitioners or physician assistants that have been trained to treat common illnesses and complaints. They're usually fairly quick and inexpensive. However, if you have serious medical issues or chronic medical problems, these are probably not your best option.  No Primary Care Doctor: - Call Health Connect at  [phone] - they can help you locate a  primary care doctor that  accepts your insurance, provides certain services, etc. - Physician Referral Service- [phone]  Chronic Pain Problems: Organization         Address  Phone   Notes  [facility]  [phone] Patients need to be referred by their primary care doctor.   Medication Assistance: Organization         Address  Phone   Notes  [facility] Medication [facility] [address] [place], [place] [address] [phone] --Must be a resident of [facility] -- Must have NO insurance coverage whatsoever (no Medicaid/ Medicare, etc.) -- The pt. MUST have a primary care doctor that directs their care regularly and follows them in the community   MedAssist  [phone]   [employer]  [phone]    Agencies that provide inexpensive medical care: Organization         Address  Phone   Notes  [facility]  [phone]   [facility]    [phone]   [facility] [address] [place], [place] [address] [phone]   [facility] [address] [place] [place] [phone]   Planned Parenthood    [phone]   [facility]    [phone]   [facility]  [address], [place] Phone:  [phone], Fax:  [phone] Hours of Operation:  9 am - 6 pm, M-F.  Also accepts Medicaid/Medicare and self-pay.  [facility]  [address], [place] Phone: [phone], Fax: [phone]. Hours of Operation:  8:30 am - 5:30 pm, M-F.  Also accepts Medicaid and self-pay.  [facility] [place] [address], [place] Phone: [phone]   [facility] [address] Natasha Bence [place], [place] [phone], Ext. [phone] Mondays & Thursdays: 7-9 AM.  First 15 patients are seen on a first come, first serve basis.    Medicaid-accepting [facility]  Providers:  Organization         Address  Phone   Notes  [facility] [address], [place] [phone] Also accepts self-pay patients.  [facility] [address] Laurell Josephs [place], [place]  [phone]   [facility] [address], [place] [phone]   [facility] [address], [place] [phone]   Renaye Rakers [address], [place]   [phone] Only accepts [place] Access [place] patients after they have their name applied to their card.   Self-Pay (no insurance) in [facility]:  Organization         Address  Phone   Notes  Sickle  Cell Patients, [facility] [address], [place] [phone]   [facility] [address], [place] [phone]   [facility]  [address], [place] [phone]   [facility]/Dr. Osei-Bonsu  [address] [place] [address], [place] or [phone], [place] [phone] Phone number for both [place] and [place] locations is the same.  [facility] [address], [place] [phone]   [facility] [address] [place] [address], [place] or [address] [phone]) [phone]   [facility] [address], [place] [phone], phone; [phone]) [phone], fax Sees patients 1st and 3rd Saturday of every month.  Must not qualify for public or private insurance (i.e. Medicaid, Medicare, [place] Health Choice, Veterans' Benefits)  Household income should be no more than 200% of the poverty level The clinic cannot treat you if you are pregnant or think you are pregnant  Sexually transmitted diseases are not treated at the clinic.    Dental Care: Organization         Address  Phone  Notes  [facility] [address], [place] [phone] Accepts children up to age 53 who are enrolled in [place] or [place] Health Choice; pregnant women with a Medicaid card; and children who have applied for Medicaid or [place] Health Choice, but were declined, whose parents can pay a reduced fee at time of service.  [facility] [place]  [address], [place] [phone] Accepts children up to age 53 who are enrolled in [place] or [place] Health Choice; pregnant women with a Medicaid card; and children who have applied for Medicaid or  Health Choice, but were declined, whose parents can pay a reduced fee at time of service.  [facility]  [address], [place] [phone] Patients are seen by appointment only. Walk-ins are not accepted. [facility] will see patients 53 years of age and older. Monday - Tuesday (8am-5pm) Most Wednesdays (8:30-5pm) $30 per visit, cash only  [facility]  [address], [facility] [place] [phone] Patients are seen by appointment only. Walk-ins are not accepted. [facility] will see patients 53 years of age and older. One Wednesday Evening (Monthly: Volunteer Based).  $30 per visit, cash only  [employer] [facility]  [phone] for adults; Children under age 564, call Graduate Pediatric Dentistry at [phone]. Children aged 704-14, please call [phone] to request a pediatric application.  Dental services are provided in all areas of dental care including fillings, crowns and bridges, complete and partial dentures, implants, gum treatment, root canals, and extractions. Preventive care is also provided. Treatment is provided to both adults and children. Patients are selected via a lottery and there is often a waiting list.   [facility] [address], [place]  [phone] [email or web address]   [facility]  [address], [place], [place] [phone], Ext. [phone] Second and Fourth Thursday of each month, opens at 6:30 AM; Clinic ends at 9 AM.  Patients are seen on a first-come first-served basis, and a limited number are seen during each clinic.   [facility]  [address], [place], [place] [phone]   Eligibility Requirements  You must have lived in [place], [place], or [place] counties for at least the last three months.   You cannot be eligible for state or federal sponsored [place], including [employer], [place], or [employer].   You generally cannot be eligible for healthcare insurance through your employer.    How to apply: Eligibility screenings are held every Tuesday and Wednesday afternoon from 1:00 pm until 4:00 pm. You do not need an appointment for the interview!  [facility] [address] [place], [place] [phone]   [facility] Health Department  [phone]   [facility] Health Department  [phone]   [facility] Health Department  [phone]    Behavioral Health Resources in the Community: Intensive Outpatient Programs Organization         Address  Phone  Notes  [facility] Services [address] [place], [place] [phone]   [facility] Outpatient [address] [place], [place] [phone]   [facility] [address] [place], [place]  [phone]   [facility] Mental Health [address]  [place], [place] [phone] or [phone]   Substance Abuse Resources Organization         Address  Phone  Notes  [facility] Drug [facility]  [phone]   [facility]  [phone]   [facility]  [phone]   Floydene Flock  [phone]   Residential & Outpatient Substance Abuse Program  [phone]   Psychological Services Organization         Address  Phone  Notes  [facility]  [phone]    [facility]  [phone]   [facility] Mental Health [address] [place] [phone] or [phone]    Mobile Crisis Teams Organization         Address  Phone  Notes  [facility], Mobile Crisis Care Unit  [phone]   [facility]  [address] [place], [place] [phone]   Doristine Locks [address] [place] [phone]    Self-Help/Support Groups Organization         Address  Phone             Notes  [facility] [place] - variety of support groups  [phone]- I7437963 Call for more information  Narcotics Anonymous (NA), [facility] [address], [employer]   2 meetings at this location   Statistician         Address  Phone  Notes  [facility] [address],    [place]  [phone]   [facility]  [address], [place] [address], [place], [place] [phone]   [facility] [address] [place], [place] [phone] Admissions: 8am-3pm M-F  [facility] [address],    [place], [place] [phone]   [facility] [address] [place], [place], [place] [phone]   [facility] [address],  [place], [place] [phone]   [facility] - Intensive Outpatient [address] Laurell Josephs [address], [place], [place] [phone]   [facility] [address] [place]  [place], [place] [phone] or [phone]   [facility] ([facility]) [address] [place], [place] [phone] Accepts Medicaid  [facility] [address]  [place] [phone] Substance Abuse/Addiction Treatment   [facility] Resources Organization         Address  Phone  Notes  [facility]  [phone]   Angie Fava, PhD [address] Ervin Knack [place], [place]   [phone] or [phone]    [facility] Behavioral   [address] [place], [place] [phone]   [facility] [address], [place], [place] [phone] Insurance/Medicaid/sponsorship through [facility] and Families [address]                                    [place], [place] [phone] Therapy/tele-psych/case  [facility] [address], [place] [phone]    Dr. Lolly Mustache  [phone]   [facility]  [employer] [facility] Dept. 1) [address] [place] 2) [address] [place] 3)  [address], [place] [phone]  [phone]   [facility] Child Abuse Hotline 786-803-8164 or 5817404404 (After Hours)

## 2013-10-06 NOTE — ED Provider Notes (Signed)
Medical screening examination/treatment/procedure(s) were performed by non-physician practitioner and as supervising physician I was immediately available for consultation/collaboration.  EKG Interpretation   None         Samanthan Dugo T Erion Hermans, MD 10/06/13 1219 

## 2014-05-15 IMAGING — CR DG CHEST 2V
2 series · 2 of 2 positions shown · non-contrast
Comparison: 09/15/2007

CLINICAL DATA: Chest pain.

CHEST - 2 VIEW

[w chest pa]
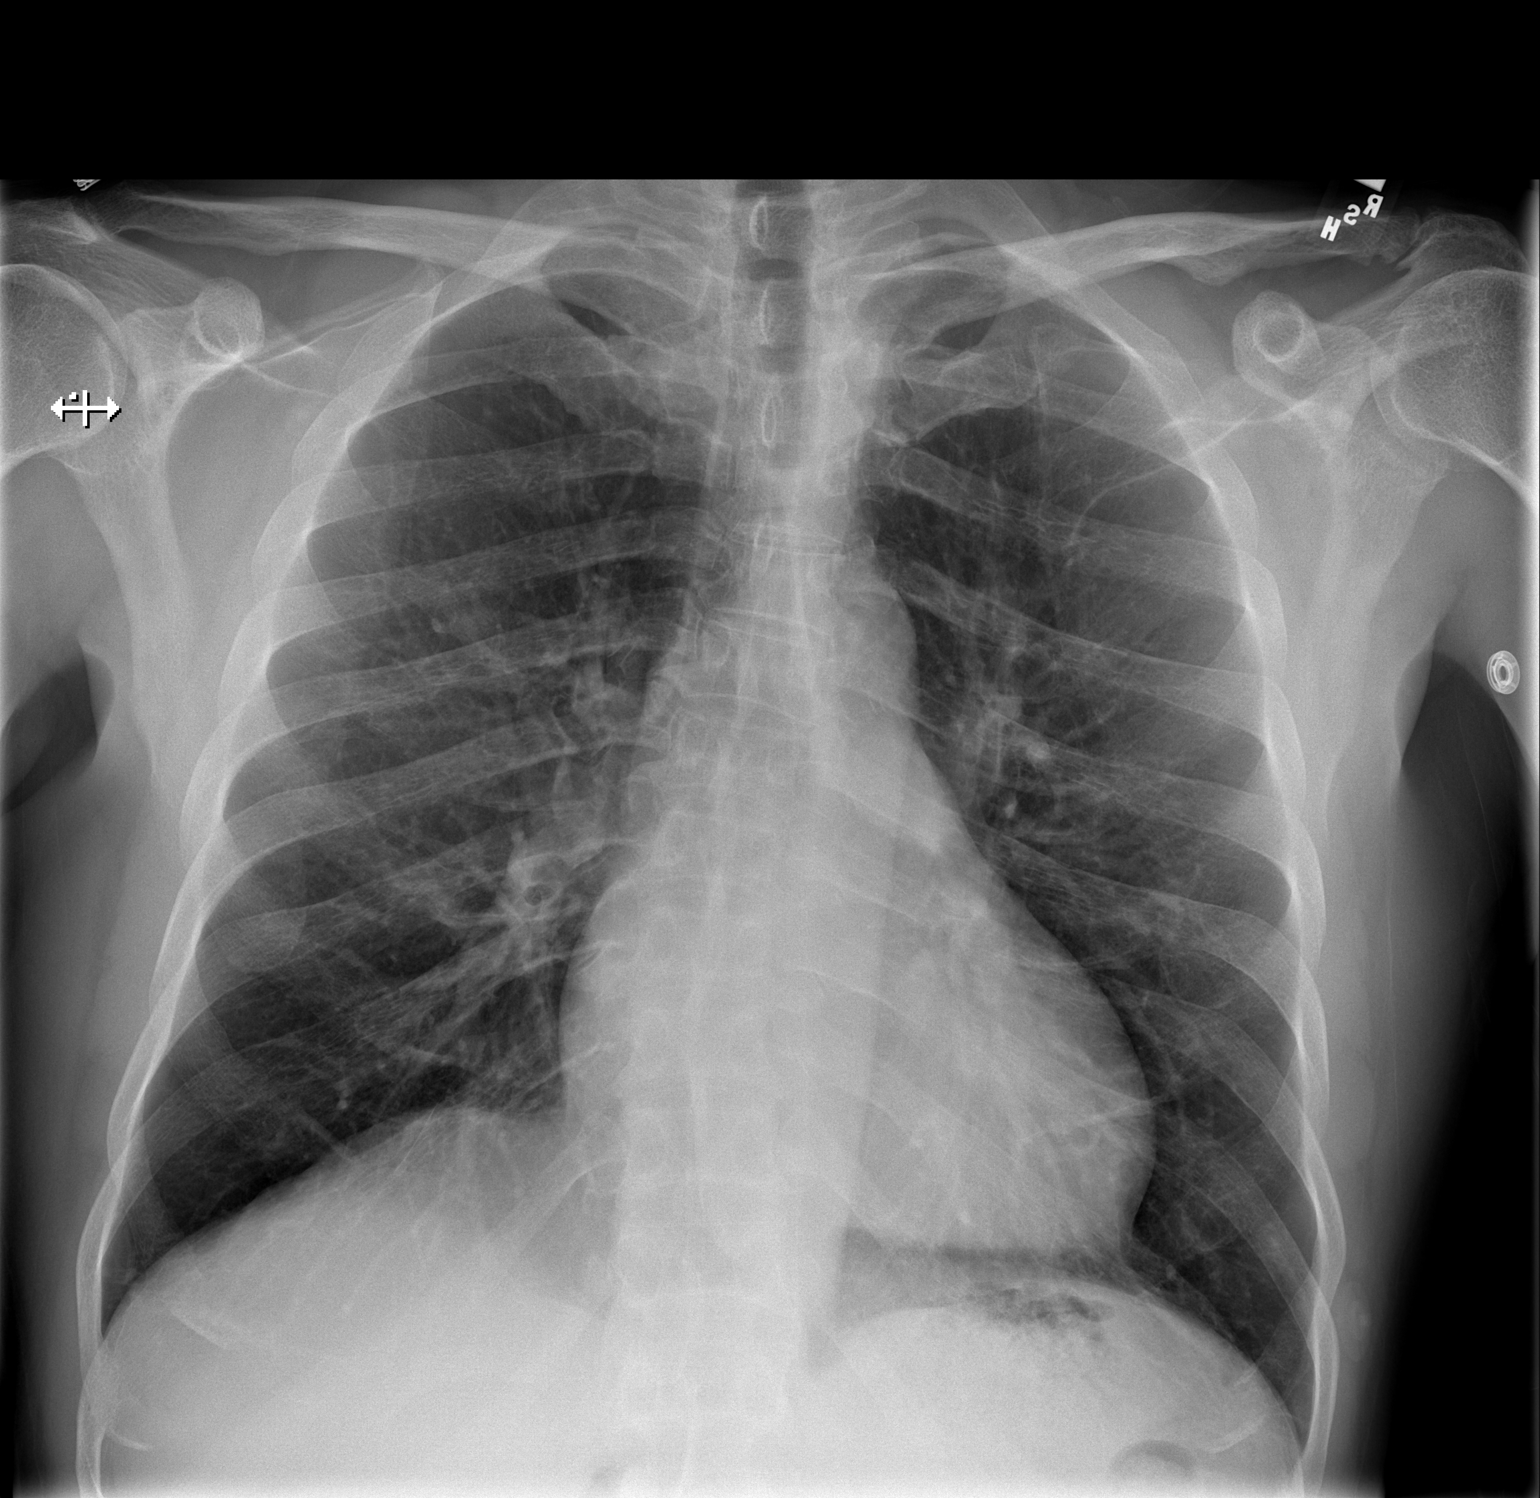

[w chest lat]
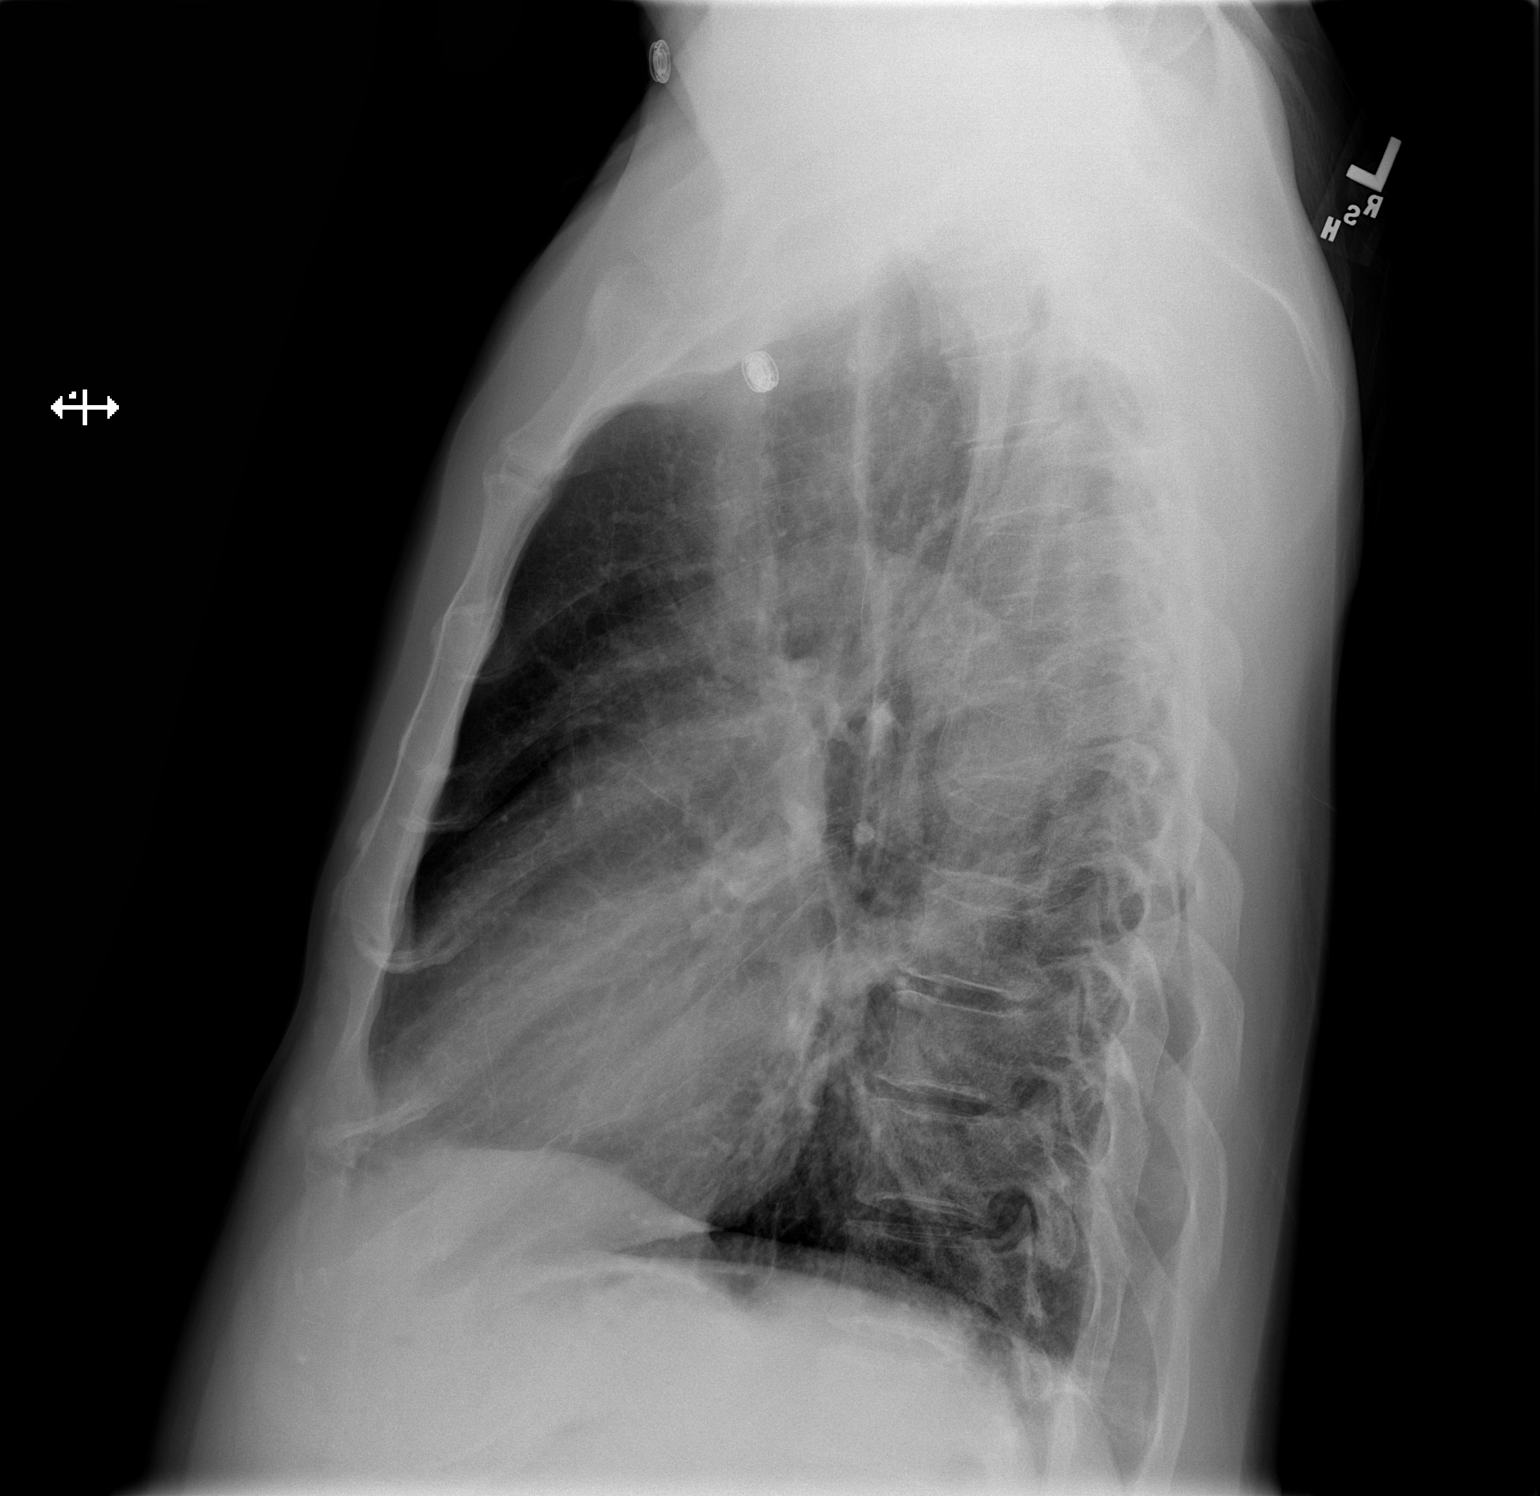

[2 of 2 positions shown; findings below may reference images not displayed]

FINDINGS: The heart size and pulmonary vascularity are normal and
the lungs are clear.  No acute osseous abnormality.  Slight
thoracic scoliosis.
IMPRESSION: No acute disease.  Of the

## 2014-09-05 ENCOUNTER — Encounter (HOSPITAL_COMMUNITY): Payer: Self-pay | Admitting: Cardiovascular Disease

## 2015-01-20 ENCOUNTER — Emergency Department (HOSPITAL_COMMUNITY)
Admission: EM | Admit: 2015-01-20 | Discharge: 2015-01-21 | Disposition: A | Discharge status: Home / Self Care | Payer: 59 | Attending: Emergency Medicine | Admitting: Emergency Medicine

## 2015-01-20 ENCOUNTER — Encounter (HOSPITAL_COMMUNITY): Payer: Self-pay | Admitting: *Deleted

## 2015-01-20 DIAGNOSIS — M545 Low back pain, unspecified: Secondary | ICD-10-CM

## 2015-01-20 DIAGNOSIS — I10 Essential (primary) hypertension: Secondary | ICD-10-CM | POA: Insufficient documentation

## 2015-01-20 DIAGNOSIS — R1084 Generalized abdominal pain: Secondary | ICD-10-CM | POA: Diagnosis present

## 2015-01-20 DIAGNOSIS — Z87891 Personal history of nicotine dependence: Secondary | ICD-10-CM | POA: Insufficient documentation

## 2015-01-20 DIAGNOSIS — R110 Nausea: Secondary | ICD-10-CM | POA: Insufficient documentation

## 2015-01-20 DIAGNOSIS — R079 Chest pain, unspecified: Secondary | ICD-10-CM | POA: Insufficient documentation

## 2015-01-20 DIAGNOSIS — R1011 Right upper quadrant pain: Secondary | ICD-10-CM | POA: Insufficient documentation

## 2015-01-20 DIAGNOSIS — R1013 Epigastric pain: Secondary | ICD-10-CM | POA: Diagnosis not present

## 2015-01-20 DIAGNOSIS — R1012 Left upper quadrant pain: Secondary | ICD-10-CM | POA: Insufficient documentation

## 2015-01-20 DIAGNOSIS — Z9889 Other specified postprocedural states: Secondary | ICD-10-CM | POA: Insufficient documentation

## 2015-01-20 MED ORDER — GI COCKTAIL ~~LOC~~
30.0000 mL | Freq: Once | ORAL | Status: AC
  Administered 2015-01-21: 30 mL via ORAL
  Filled 2015-01-20: qty 30

## 2015-01-20 MED ORDER — SODIUM CHLORIDE 0.9 % IV SOLN
1000.0000 mL | INTRAVENOUS | Status: DC
  Administered 2015-01-21: 1000 mL via INTRAVENOUS

## 2015-01-20 MED ORDER — SODIUM CHLORIDE 0.9 % IV SOLN
1000.0000 mL | Freq: Once | INTRAVENOUS | Status: AC
  Administered 2015-01-21: 1000 mL via INTRAVENOUS

## 2015-01-20 MED ORDER — ONDANSETRON HCL 4 MG/2ML IJ SOLN
4.0000 mg | Freq: Once | INTRAMUSCULAR | Status: AC
  Administered 2015-01-21: 4 mg via INTRAVENOUS
  Filled 2015-01-20: qty 2

## 2015-01-20 NOTE — ED Notes (Signed)
Pt reporting generalized abdominal pain.  Reports nausea, but denies vomiting.  Reports pain is a burning sensation. Denis problems with diarrhea or constipation.

## 2015-01-20 NOTE — ED Provider Notes (Signed)
CSN: [id number]     Arrival date & time 01/20/15  2203 History  This chart was scribed for Dione Boozeavid Emmerie Battaglia, MD by Tanda RockersMargaux Venter, ED Scribe. This patient was seen in room APA05/APA05 and the patient's care was started at 11:49 PM.    Chief Complaint  Patient presents with  . Abdominal Pain   The history is provided by the patient. No language interpreter was used.     HPI Comments: Ivan Murphy is a 54 y.o. male with hx HTN who presents to the Emergency Department complaining of sudden onset mid chest pain, diffuse abdominal pain, and lower back pain that began around 9 PM tonight (3 hours ago). He describes it as a burning sensation. He rates the pain as an 8/10 on the pain scale. The pain is exacerbated with laying down. Pt also complains of nausea. He mentions similar symptoms in the past and had X rays done at that time. Denies vomiting, constipation, diarrhea, fever, chills, diaphoresis, or any other symptoms. Pt has cardiac cath done on 02/28/2013 (2 years ago) which was normal.   PCP - Margo AyeHall  Past Medical History  Diagnosis Date  . Chest pain at rest 02/26/2013  . Headache(784.0)   . S/P cardiac cath, 02/28/13 02/28/2013  . Abnormal EKG, felt to be due to hypertension  02/28/2013  . Hypertension    Past Surgical History  Procedure Laterality Date  . Irrigation and debridement abscess    . Cardiac catheterization  02/28/2013  . Left heart catheterization with coronary angiogram N/A 02/28/2013    Procedure: LEFT HEART CATHETERIZATION WITH CORONARY ANGIOGRAM;  Surgeon: Runell GessJonathan J Berry, MD;  [facility] C[facility];  Service: Cardiovascular;  Laterality: N/A;   Family History  Problem Relation Age of Onset  . Diabetes Brother    History  Substance Use Topics  . Smoking status: Former Smoker -- 1.00 packs/day for 39 years    Types: Cigarettes    Quit date: 02/12/2013  . Smokeless tobacco: Never Used  . Alcohol Use: Yes    Review of Systems  Constitutional: Negative for fever, chills and  diaphoresis.  Cardiovascular: Positive for chest pain (Mid chest pain. ).  Gastrointestinal: Positive for nausea and abdominal pain. Negative for vomiting, diarrhea and constipation.  Musculoskeletal: Positive for back pain (Lower back pain. ).  All other systems reviewed and are negative.     Allergies  Review of patient's allergies indicates no known allergies.  Home Medications   Prior to Admission medications   Medication Sig Start Date End Date Taking? Authorizing Provider  meclizine (ANTIVERT) 25 MG tablet Take 1 tablet (25 mg total) by mouth 4 (four) times daily as needed for dizziness. 10/05/13   Hannah Muthersbaugh, PA-C  naproxen sodium (ANAPROX) 220 MG tablet Take 220 mg by mouth 2 (two) times daily as needed (for pain).     Historical Provider, MD  oseltamivir (TAMIFLU) 75 MG capsule Take 1 capsule (75 mg total) by mouth every 12 (twelve) hours. 10/05/13   Hannah Muthersbaugh, PA-C  oxyCODONE (ROXICODONE) 5 MG/5ML solution Take 5 mLs (5 mg total) by mouth every 4 (four) hours as needed for severe pain. 10/05/13   Hannah Muthersbaugh, PA-C   Triage Vitals: BP 153/90 mmHg  Pulse 73  Temp(Src) 98.5 F (36.9 C) (Oral)  Resp 18  Wt 190 lb (86.183 kg)  SpO2 95%   Physical Exam  Constitutional: He is oriented to person, place, and time. He appears well-developed and well-nourished. No distress.  HENT:  Head: Normocephalic and atraumatic.  Eyes: Conjunctivae and EOM are normal. Pupils are equal, round, and reactive to light.  Neck: Normal range of motion. Neck supple. No JVD present.  Cardiovascular: Normal rate, regular rhythm and normal heart sounds.   No murmur heard. Pulmonary/Chest: Effort normal and breath sounds normal. He has no wheezes. He has no rales. He exhibits no tenderness.  Abdominal: Soft. He exhibits no distension and no mass. There is tenderness (Tenderness to epigastriu, RUQ, and LUQ. ).  Bowel sounds decreased.   Musculoskeletal: Normal range of motion. He  exhibits no edema.  Lymphadenopathy:    He has no cervical adenopathy.  Neurological: He is alert and oriented to person, place, and time. No cranial nerve deficit. Coordination normal.  Skin: Skin is warm and dry. No rash noted.  Psychiatric: He has a normal mood and affect. His behavior is normal. Judgment and thought content normal.  Nursing note and vitals reviewed.   ED Course  Procedures (including critical care time)  DIAGNOSTIC STUDIES: Oxygen Saturation is 95% on RA, normal by my interpretation.    COORDINATION OF CARE: 11:55 PM-Discussed treatment plan which includes Zofran, UA, CBC with pt at bedside and pt agreed to plan.   Labs Review Results for orders placed or performed during the hospital encounter of 01/20/15  CBC with Differential  Result Value Ref Range   WBC 7.6 4.0 - 10.5 K/uL   RBC 5.15 4.22 - 5.81 MIL/uL   Hemoglobin 14.4 13.0 - 17.0 g/dL   HCT 16.1 09.6 - 04.5 %   MCV 81.7 78.0 - 100.0 fL   MCH 28.0 26.0 - 34.0 pg   MCHC 34.2 30.0 - 36.0 g/dL   RDW 40.9 81.1 - 91.4 %   Platelets 183 150 - 400 K/uL   Neutrophils Relative % 55 43 - 77 %   Neutro Abs 4.2 1.7 - 7.7 K/uL   Lymphocytes Relative 32 12 - 46 %   Lymphs Abs 2.5 0.7 - 4.0 K/uL   Monocytes Relative 11 3 - 12 %   Monocytes Absolute 0.8 0.1 - 1.0 K/uL   Eosinophils Relative 2 0 - 5 %   Eosinophils Absolute 0.2 0.0 - 0.7 K/uL   Basophils Relative 0 0 - 1 %   Basophils Absolute 0.0 0.0 - 0.1 K/uL  Comprehensive metabolic panel  Result Value Ref Range   Sodium 138 135 - 145 mmol/L   Potassium 4.0 3.5 - 5.1 mmol/L   Chloride 106 96 - 112 mmol/L   CO2 26 19 - 32 mmol/L   Glucose, Bld 101 (H) 70 - 99 mg/dL   BUN 7 6 - 23 mg/dL   Creatinine, Ser 7.82 0.50 - 1.35 mg/dL   Calcium 9.0 8.4 - 95.6 mg/dL   Total Protein 7.4 6.0 - 8.3 g/dL   Albumin 4.0 3.5 - 5.2 g/dL   AST 18 0 - 37 U/L   ALT 19 0 - 53 U/L   Alkaline Phosphatase 52 39 - 117 U/L   Total Bilirubin 0.7 0.3 - 1.2 mg/dL   GFR calc  non Af Amer >90 >90 mL/min   GFR calc Af Amer >90 >90 mL/min   Anion gap 6 5 - 15  Lipase, blood  Result Value Ref Range   Lipase 49 11 - 59 U/L  Troponin I  Result Value Ref Range   Troponin I <0.03 <0.031 ng/mL  Urinalysis, Routine w reflex microscopic  Result Value Ref Range   Color, Urine YELLOW YELLOW  APPearance CLEAR CLEAR   Specific Gravity, Urine 1.010 1.005 - 1.030   pH 6.5 5.0 - 8.0   Glucose, UA NEGATIVE NEGATIVE mg/dL   Hgb urine dipstick NEGATIVE NEGATIVE   Bilirubin Urine NEGATIVE NEGATIVE   Ketones, ur NEGATIVE NEGATIVE mg/dL   Protein, ur NEGATIVE NEGATIVE mg/dL   Urobilinogen, UA 0.2 0.0 - 1.0 mg/dL   Nitrite NEGATIVE NEGATIVE   Leukocytes, UA NEGATIVE NEGATIVE    MDM   Final diagnoses:  None    Epigastric burning suggestive of GERD or peptic ulcer disease. Low back pain of uncertain cause. No pulsatile mass felt on exam. Old records are reviewed and I cannot find evidence of a physical for abdominal pain with abdominal imaging. However, imaging of lumbar spine showed no evidence of aortic aneurysm. He is given IV fluid , GI cocktail, ondansetron with good relief of abdominal complete 3 still complained of low back pain. He is given a dose of pantoprazole and tramadol and is discharged with prescriptions for both of those and is to follow-up with his PCP later this week.    I personally performed the services described in this documentation, which was scribed in my presence. The recorded information has been reviewed and is accurate.       Dione Booze, MD 01/21/15 [phone]

## 2015-01-21 LAB — CBC WITH DIFFERENTIAL/PLATELET
BASOS ABS: 0 10*3/uL (ref 0.0–0.1)
Basophils Relative: 0 % (ref 0–1)
EOS PCT: 2 % (ref 0–5)
Eosinophils Absolute: 0.2 10*3/uL (ref 0.0–0.7)
HCT: 42.1 % (ref 39.0–52.0)
Hemoglobin: 14.4 g/dL (ref 13.0–17.0)
LYMPHS ABS: 2.5 10*3/uL (ref 0.7–4.0)
Lymphocytes Relative: 32 % (ref 12–46)
MCH: 28 pg (ref 26.0–34.0)
MCHC: 34.2 g/dL (ref 30.0–36.0)
MCV: 81.7 fL (ref 78.0–100.0)
Monocytes Absolute: 0.8 10*3/uL (ref 0.1–1.0)
Monocytes Relative: 11 % (ref 3–12)
NEUTROS PCT: 55 % (ref 43–77)
Neutro Abs: 4.2 10*3/uL (ref 1.7–7.7)
PLATELETS: 183 10*3/uL (ref 150–400)
RBC: 5.15 MIL/uL (ref 4.22–5.81)
RDW: 14.7 % (ref 11.5–15.5)
WBC: 7.6 10*3/uL (ref 4.0–10.5)

## 2015-01-21 LAB — URINALYSIS, ROUTINE W REFLEX MICROSCOPIC
BILIRUBIN URINE: NEGATIVE
Glucose, UA: NEGATIVE mg/dL
HGB URINE DIPSTICK: NEGATIVE
Ketones, ur: NEGATIVE mg/dL
Leukocytes, UA: NEGATIVE
Nitrite: NEGATIVE
PH: 6.5 (ref 5.0–8.0)
PROTEIN: NEGATIVE mg/dL
SPECIFIC GRAVITY, URINE: 1.01 (ref 1.005–1.030)
UROBILINOGEN UA: 0.2 mg/dL (ref 0.0–1.0)

## 2015-01-21 LAB — COMPREHENSIVE METABOLIC PANEL
ALT: 19 U/L (ref 0–53)
AST: 18 U/L (ref 0–37)
Albumin: 4 g/dL (ref 3.5–5.2)
Alkaline Phosphatase: 52 U/L (ref 39–117)
Anion gap: 6 (ref 5–15)
BILIRUBIN TOTAL: 0.7 mg/dL (ref 0.3–1.2)
BUN: 7 mg/dL (ref 6–23)
CALCIUM: 9 mg/dL (ref 8.4–10.5)
CO2: 26 mmol/L (ref 19–32)
Chloride: 106 mmol/L (ref 96–112)
Creatinine, Ser: 0.83 mg/dL (ref 0.50–1.35)
GFR calc Af Amer: >90 mL/min (ref >90)
GFR calc non Af Amer: >90 mL/min (ref >90)
Glucose, Bld: 101 mg/dL — ABNORMAL HIGH (ref 70–99)
POTASSIUM: 4 mmol/L (ref 3.5–5.1)
Sodium: 138 mmol/L (ref 135–145)
TOTAL PROTEIN: 7.4 g/dL (ref 6.0–8.3)

## 2015-01-21 LAB — TROPONIN I: Troponin I: <0.03 ng/mL (ref <0.031)

## 2015-01-21 LAB — LIPASE, BLOOD: Lipase: 49 U/L (ref 11–59)

## 2015-01-21 MED ORDER — TRAMADOL HCL 50 MG PO TABS
50.0000 mg | ORAL_TABLET | Freq: Once | ORAL | Status: AC
  Administered 2015-01-21: 50 mg via ORAL
  Filled 2015-01-21: qty 1

## 2015-01-21 MED ORDER — TRAMADOL HCL 50 MG PO TABS: 50.0000 mg | ORAL_TABLET | Freq: Four times a day (QID) | ORAL | Status: DC | PRN

## 2015-01-21 MED ORDER — PANTOPRAZOLE SODIUM 40 MG PO TBEC: 40.0000 mg | DELAYED_RELEASE_TABLET | Freq: Every day | ORAL | Status: DC

## 2015-01-21 MED ORDER — PANTOPRAZOLE SODIUM 40 MG IV SOLR
40.0000 mg | Freq: Once | INTRAVENOUS | Status: AC
  Administered 2015-01-21: 40 mg via INTRAVENOUS
  Filled 2015-01-21: qty 40

## 2015-01-21 NOTE — Discharge Instructions (Signed)
Abdominal Pain Many things can cause abdominal pain. Usually, abdominal pain is not caused by a disease and will improve without treatment. It can often be observed and treated at home. Your health care provider will do a physical exam and possibly order blood tests and X-rays to help determine the seriousness of your pain. However, in many cases, more time must pass before a clear cause of the pain can be found. Before that point, your health care provider may not know if you need more testing or further treatment. HOME CARE INSTRUCTIONS  Monitor your abdominal pain for any changes. The following actions may help to alleviate any discomfort you are experiencing:  Only take over-the-counter or prescription medicines as directed by your health care provider.  Do not take laxatives unless directed to do so by your health care provider.  Try a clear liquid diet (broth, tea, or water) as directed by your health care provider. Slowly move to a bland diet as tolerated. SEEK MEDICAL CARE IF:  You have unexplained abdominal pain.  You have abdominal pain associated with nausea or diarrhea.  You have pain when you urinate or have a bowel movement.  You experience abdominal pain that wakes you in the night.  You have abdominal pain that is worsened or improved by eating food.  You have abdominal pain that is worsened with eating fatty foods.  You have a fever. SEEK IMMEDIATE MEDICAL CARE IF:   Your pain does not go away within 2 hours.  You keep throwing up (vomiting).  Your pain is felt only in portions of the abdomen, such as the right side or the left lower portion of the abdomen.  You pass bloody or black tarry stools. MAKE SURE YOU:  Understand these instructions.   Will watch your condition.   Will get help right away if you are not doing well or get worse.  Document Released: 06/23/2005 Document Revised: 09/18/2013 Document Reviewed: 05/23/2013 [facility] Patient Information  2015 [place], [place]. This information is not intended to replace advice given to you by your health care provider. Make sure you discuss any questions you have with your health care provider.  Back Pain, Adult Low back pain is very common. About 1 in 5 people have back pain.The cause of low back pain is rarely dangerous. The pain often gets better over time.About half of people with a sudden onset of back pain feel better in just 2 weeks. About 8 in 10 people feel better by 6 weeks.  CAUSES Some common causes of back pain include:  Strain of the muscles or ligaments supporting the spine.  Wear and tear (degeneration) of the spinal discs.  Arthritis.  Direct injury to the back. DIAGNOSIS Most of the time, the direct cause of low back pain is not known.However, back pain can be treated effectively even when the exact cause of the pain is unknown.Answering your caregiver's questions about your overall health and symptoms is one of the most accurate ways to make sure the cause of your pain is not dangerous. If your caregiver needs more information, he or she may order lab work or imaging tests (X-rays or MRIs).However, even if imaging tests show changes in your back, this usually does not require surgery. HOME CARE INSTRUCTIONS For many people, back pain returns.Since low back pain is rarely dangerous, it is often a condition that people can learn to [facility] their own.   Remain active. It is stressful on the back to sit or stand in  one place. Do not sit, drive, or stand in one place for more than 30 minutes at a time. Take short walks on level surfaces as soon as pain allows.Try to increase the length of time you walk each day.  Do not stay in bed.Resting more than 1 or 2 days can delay your recovery.  Do not avoid exercise or work.Your body is made to move.It is not dangerous to be active, even though your back may hurt.Your back will likely heal faster if you return to being active  before your pain is gone.  Pay attention to your body when you bend and lift. Many people have less discomfortwhen lifting if they bend their knees, keep the load close to their bodies,and avoid twisting. Often, the most comfortable positions are those that put less stress on your recovering back.  Find a comfortable position to sleep. Use a firm mattress and lie on your side with your knees slightly bent. If you lie on your back, put a pillow under your knees.  Only take over-the-counter or prescription medicines as directed by your caregiver. Over-the-counter medicines to reduce pain and inflammation are often the most helpful.Your caregiver may prescribe muscle relaxant drugs.These medicines help dull your pain so you can more quickly return to your normal activities and healthy exercise.  Put ice on the injured area.  Put ice in a plastic bag.  Place a towel between your skin and the bag.  Leave the ice on for 15-20 minutes, 03-04 times a day for the first 2 to 3 days. After that, ice and heat may be alternated to reduce pain and spasms.  Ask your caregiver about trying back exercises and gentle massage. This may be of some benefit.  Avoid feeling anxious or stressed.Stress increases muscle tension and can worsen back pain.It is important to recognize when you are anxious or stressed and learn ways to manage it.Exercise is a great option. SEEK MEDICAL CARE IF:  You have pain that is not relieved with rest or medicine.  You have pain that does not improve in 1 week.  You have new symptoms.  You are generally not feeling well. SEEK IMMEDIATE MEDICAL CARE IF:   You have pain that radiates from your back into your legs.  You develop new bowel or bladder control problems.  You have unusual weakness or numbness in your arms or legs.  You develop nausea or vomiting.  You develop abdominal pain.  You feel faint. Document Released: 09/13/2005 Document Revised: 03/14/2012  Document Reviewed: 01/15/2014 [facility] Patient Information 2015 [place], [place]. This information is not intended to replace advice given to you by your health care provider. Make sure you discuss any questions you have with your health care provider.  Tramadol tablets What is this medicine? TRAMADOL (TRA ma dole) is a pain reliever. It is used to treat moderate to severe pain in adults. This medicine may be used for other purposes; ask your health care provider or pharmacist if you have questions. COMMON BRAND NAME(S): Ultram What should I tell my health care provider before I take this medicine? They need to know if you have any of these conditions: -brain tumor -depression -drug abuse or addiction -head injury -if you frequently drink alcohol containing drinks -kidney disease or trouble passing urine -liver disease -lung disease, asthma, or breathing problems -seizures or epilepsy -suicidal thoughts, plans, or attempt; a previous suicide attempt by you or a family member -an unusual or allergic reaction to tramadol, codeine, other medicines,  foods, dyes, or preservatives -pregnant or trying to get pregnant -breast-feeding How should I use this medicine? Take this medicine by mouth with a full glass of water. Follow the directions on the prescription label. If the medicine upsets your stomach, take it with food or milk. Do not take more medicine than you are told to take. Talk to your pediatrician regarding the use of this medicine in children. Special care may be needed. Overdosage: If you think you have taken too much of this medicine contact a poison control center or emergency room at once. NOTE: This medicine is only for you. Do not share this medicine with others. What if I miss a dose? If you miss a dose, take it as soon as you can. If it is almost time for your next dose, take only that dose. Do not take double or extra doses. What may interact with this medicine? Do not take  this medicine with any of the following medications: -MAOIs like Carbex, Eldepryl, Marplan, Nardil, and Parnate This medicine may also interact with the following medications: -alcohol or medicines that contain alcohol -antihistamines -benzodiazepines -bupropion -carbamazepine or oxcarbazepine -clozapine -cyclobenzaprine -digoxin -furazolidone -linezolid -medicines for depression, anxiety, or psychotic disturbances -medicines for migraine headache like almotriptan, eletriptan, frovatriptan, naratriptan, rizatriptan, sumatriptan, zolmitriptan -medicines for pain like pentazocine, buprenorphine, butorphanol, meperidine, nalbuphine, and propoxyphene -medicines for sleep -muscle relaxants -naltrexone -phenobarbital -phenothiazines like perphenazine, thioridazine, chlorpromazine, mesoridazine, fluphenazine, prochlorperazine, promazine, and trifluoperazine -procarbazine -warfarin This list may not describe all possible interactions. Give your health care provider a list of all the medicines, herbs, non-prescription drugs, or dietary supplements you use. Also tell them if you smoke, drink alcohol, or use illegal drugs. Some items may interact with your medicine. What should I watch for while using this medicine? Tell your doctor or health care professional if your pain does not go away, if it gets worse, or if you have new or a different type of pain. You may develop tolerance to the medicine. Tolerance means that you will need a higher dose of the medicine for pain relief. Tolerance is normal and is expected if you take this medicine for a long time. Do not suddenly stop taking your medicine because you may develop a severe reaction. Your body becomes used to the medicine. This does NOT mean you are addicted. Addiction is a behavior related to getting and using a drug for a non-medical reason. If you have pain, you have a medical reason to take pain medicine. Your doctor will tell you how much  medicine to take. If your doctor wants you to stop the medicine, the dose will be slowly lowered over time to avoid any side effects. You may get drowsy or dizzy. Do not drive, use machinery, or do anything that needs mental alertness until you know how this medicine affects you. Do not stand or sit up quickly, especially if you are an older patient. This reduces the risk of dizzy or fainting spells. Alcohol can increase or decrease the effects of this medicine. Avoid alcoholic drinks. You may have constipation. Try to have a bowel movement at least every 2 to 3 days. If you do not have a bowel movement for 3 days, call your doctor or health care professional. Your mouth may get dry. Chewing sugarless gum or sucking hard candy, and drinking plenty of water may help. Contact your doctor if the problem does not go away or is severe. What side effects may I notice from receiving this medicine?  Side effects that you should report to your doctor or health care professional as soon as possible: -allergic reactions like skin rash, itching or hives, swelling of the face, lips, or tongue -breathing difficulties, wheezing -confusion -itching -light headedness or fainting spells -redness, blistering, peeling or loosening of the skin, including inside the mouth -seizures Side effects that usually do not require medical attention (report to your doctor or health care professional if they continue or are bothersome): -constipation -dizziness -drowsiness -headache -nausea, vomiting This list may not describe all possible side effects. Call your doctor for medical advice about side effects. You may report side effects to FDA at 1-800-FDA-1088. Where should I keep my medicine? Keep out of the reach of children. Store at room temperature between 15 and 30 degrees C (59 and 86 degrees F). Keep container tightly closed. Throw away any unused medicine after the expiration date. NOTE: This sheet is a summary. It  may not cover all possible information. If you have questions about this medicine, talk to your doctor, pharmacist, or health care provider.  2015, Elsevier/Gold Standard. (2010-05-27 11:55:44)  Pantoprazole tablets What is this medicine? PANTOPRAZOLE (pan TOE pra zole) prevents the production of acid in the stomach. It is used to treat gastroesophageal reflux disease (GERD), inflammation of the esophagus, and Zollinger-Ellison syndrome. This medicine may be used for other purposes; ask your health care provider or pharmacist if you have questions. COMMON BRAND NAME(S): Protonix What should I tell my health care provider before I take this medicine? They need to know if you have any of these conditions: -liver disease -low levels of magnesium in the blood -an unusual or allergic reaction to omeprazole, lansoprazole, pantoprazole, rabeprazole, other medicines, foods, dyes, or preservatives -pregnant or trying to get pregnant -breast-feeding How should I use this medicine? Take this medicine by mouth. Swallow the tablets whole with a drink of water. Follow the directions on the prescription label. Do not crush, break, or chew. Take your medicine at regular intervals. Do not take your medicine more often than directed. Talk to your pediatrician regarding the use of this medicine in children. While this drug may be prescribed for children as young as 5 years for selected conditions, precautions do apply. Overdosage: If you think you have taken too much of this medicine contact a poison control center or emergency room at once. NOTE: This medicine is only for you. Do not share this medicine with others. What if I miss a dose? If you miss a dose, take it as soon as you can. If it is almost time for your next dose, take only that dose. Do not take double or extra doses. What may interact with this medicine? Do not take this medicine with any of the following  medications: -atazanavir -nelfinavir This medicine may also interact with the following medications: -ampicillin -delavirdine -digoxin -diuretics -iron salts -medicines for fungal infections like ketoconazole, itraconazole and voriconazole -warfarin This list may not describe all possible interactions. Give your health care provider a list of all the medicines, herbs, non-prescription drugs, or dietary supplements you use. Also tell them if you smoke, drink alcohol, or use illegal drugs. Some items may interact with your medicine. What should I watch for while using this medicine? It can take several days before your stomach pain gets better. Check with your doctor or health care professional if your condition does not start to get better, or if it gets worse. You may need blood work done while you are taking this  medicine. What side effects may I notice from receiving this medicine? Side effects that you should report to your doctor or health care professional as soon as possible: -allergic reactions like skin rash, itching or hives, swelling of the face, lips, or tongue -bone, muscle or joint pain -breathing problems -chest pain or chest tightness -dark yellow or brown urine -dizziness -fast, irregular heartbeat -feeling faint or lightheaded -fever or sore throat -muscle spasm -palpitations -redness, blistering, peeling or loosening of the skin, including inside the mouth -seizures -tremors -unusual bleeding or bruising -unusually weak or tired -yellowing of the eyes or skin Side effects that usually do not require medical attention (Report these to your doctor or health care professional if they continue or are bothersome.): -constipation -diarrhea -dry mouth -headache -nausea This list may not describe all possible side effects. Call your doctor for medical advice about side effects. You may report side effects to FDA at 1-800-FDA-1088. Where should I keep my  medicine? Keep out of the reach of children. Store at room temperature between 15 and 30 degrees C (59 and 86 degrees F). Protect from light and moisture. Throw away any unused medicine after the expiration date. NOTE: This sheet is a summary. It may not cover all possible information. If you have questions about this medicine, talk to your doctor, pharmacist, or health care provider.  2015, Elsevier/Gold Standard. (2012-07-12 16:40:16)

## 2015-05-15 ENCOUNTER — Emergency Department (HOSPITAL_COMMUNITY)
Admission: EM | Admit: 2015-05-15 | Discharge: 2015-05-15 | Disposition: A | Discharge status: Home / Self Care | Payer: 59 | Attending: Emergency Medicine | Admitting: Emergency Medicine

## 2015-05-15 ENCOUNTER — Emergency Department (HOSPITAL_COMMUNITY): Payer: 59

## 2015-05-15 ENCOUNTER — Encounter (HOSPITAL_COMMUNITY): Payer: Self-pay | Admitting: Emergency Medicine

## 2015-05-15 DIAGNOSIS — Z720 Tobacco use: Secondary | ICD-10-CM | POA: Diagnosis not present

## 2015-05-15 DIAGNOSIS — I10 Essential (primary) hypertension: Secondary | ICD-10-CM | POA: Diagnosis not present

## 2015-05-15 DIAGNOSIS — R079 Chest pain, unspecified: Secondary | ICD-10-CM

## 2015-05-15 DIAGNOSIS — Z9889 Other specified postprocedural states: Secondary | ICD-10-CM | POA: Insufficient documentation

## 2015-05-15 LAB — BASIC METABOLIC PANEL
Anion gap: 7 (ref 5–15)
BUN: 13 mg/dL (ref 6–20)
CHLORIDE: 105 mmol/L (ref 101–111)
CO2: 25 mmol/L (ref 22–32)
Calcium: 8.7 mg/dL — ABNORMAL LOW (ref 8.9–10.3)
Creatinine, Ser: 1.06 mg/dL (ref 0.61–1.24)
GFR calc Af Amer: >60 mL/min (ref >60)
GFR calc non Af Amer: >60 mL/min (ref >60)
GLUCOSE: 152 mg/dL — AB (ref 65–99)
POTASSIUM: 3.7 mmol/L (ref 3.5–5.1)
SODIUM: 137 mmol/L (ref 135–145)

## 2015-05-15 LAB — CBC
HEMATOCRIT: 40.8 % (ref 39.0–52.0)
Hemoglobin: 13.8 g/dL (ref 13.0–17.0)
MCH: 27.8 pg (ref 26.0–34.0)
MCHC: 33.8 g/dL (ref 30.0–36.0)
MCV: 82.1 fL (ref 78.0–100.0)
Platelets: 182 10*3/uL (ref 150–400)
RBC: 4.97 MIL/uL (ref 4.22–5.81)
RDW: 14.3 % (ref 11.5–15.5)
WBC: 5.8 10*3/uL (ref 4.0–10.5)

## 2015-05-15 LAB — TROPONIN I: Troponin I: <0.03 ng/mL (ref <0.031)

## 2015-05-15 MED ORDER — OXYCODONE-ACETAMINOPHEN 5-325 MG PO TABS: 1.0000 | ORAL_TABLET | Freq: Four times a day (QID) | ORAL | Status: DC | PRN

## 2015-05-15 NOTE — ED Notes (Signed)
[place] and [place] chest pain.  States that pain comes and goes depending on position and denies other symptoms.

## 2015-05-15 NOTE — Discharge Instructions (Signed)
Tests were normal. Follow-up your primary care doctor. Medication for pain. Return if worse

## 2015-05-15 NOTE — ED Notes (Signed)
Pt made aware to return if symptoms worsen or if any life threatening symptoms occur.   

## 2015-05-15 NOTE — ED Provider Notes (Addendum)
CSN: [id number]     Arrival date & time 05/15/15  1045 History  This chart was scribed for Donnetta Hutching, MD by Elon Spanner, ED Scribe. This patient was seen in room APA18/APA18 and the patient's care was started at 11:17 AM.   Chief Complaint  Patient presents with  . Chest Pain    The history is provided by the patient. No language interpreter was used.   HPI Comments: Ivan Murphy is a 54 y.o. male with no significant history who presents to the Emergency Department complaining of improving, sharp, intermittent (2-3 minute episodes), right-sided CP onset 3 hours ago.  Associated symptoms included nausea.  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  Patient is a 0.5 pack/day smoker.  There is no family hx of cardiac conditions.  He denies SOB, diaphoresis. Cardiac catheterization on 02/28/2013 was normal  Past Medical History  Diagnosis Date  . Chest pain at rest 02/26/2013  . Headache(784.0)   . S/P cardiac cath, 02/28/13 02/28/2013  . Abnormal EKG, felt to be due to hypertension  02/28/2013  . Hypertension    Past Surgical History  Procedure Laterality Date  . Irrigation and debridement abscess    . Cardiac catheterization  02/28/2013  . Left heart catheterization with coronary angiogram N/A 02/28/2013    Procedure: LEFT HEART CATHETERIZATION WITH CORONARY ANGIOGRAM;  Surgeon: Runell Gess, MD;  Location: [facility] CATH LAB;  Service: Cardiovascular;  Laterality: N/A;   Family History  Problem Relation Age of Onset  . Diabetes Brother    Social History  Substance Use Topics  . Smoking status: Current Every Day Smoker -- 0.50 packs/day for 39 years    Types: Cigarettes  . Smokeless tobacco: Never Used  . Alcohol Use: Yes    Review of Systems A complete 10 system review of systems was obtained and all systems are negative except as noted in the HPI and PMH.   Allergies  Review of patient's allergies indicates no known allergies.  Home Medications    Prior to Admission medications   Medication Sig Start Date End Date Taking? Authorizing Provider  naproxen sodium (ANAPROX) 220 MG tablet Take 440 mg by mouth 2 (two) times daily as needed (for pain).    Yes Historical Provider, MD  oxyCODONE-acetaminophen (PERCOCET/ROXICET) 5-325 MG per tablet Take 1-2 tablets by mouth every 6 (six) hours as needed. 05/15/15   Donnetta Hutching, MD  pantoprazole (PROTONIX) 40 MG tablet Take 1 tablet (40 mg total) by mouth daily. Patient not taking: Reported on 05/15/2015 01/21/15   Dione Booze, MD  traMADol (ULTRAM) 50 MG tablet Take 1 tablet (50 mg total) by mouth every 6 (six) hours as needed. Patient not taking: Reported on 05/15/2015 01/21/15   Dione Booze, MD   BP 119/74 mmHg  Pulse 58  Temp(Src) 97.9 F (36.6 C) (Oral)  Resp 12  Ht 5\' 7"  (1.702 m)  Wt 190 lb (86.183 kg)  BMI 29.75 kg/m2  SpO2 95% Physical Exam  Constitutional: He is oriented to person, place, and time. He appears well-developed and well-nourished.  HENT:  Head: Normocephalic and atraumatic.  Eyes: Conjunctivae and EOM are normal. Pupils are equal, round, and reactive to light.  Neck: Normal range of motion. Neck supple.  Cardiovascular: Normal rate and regular rhythm.   Pulmonary/Chest: Effort normal and breath sounds normal.  Abdominal: Soft. Bowel sounds are normal.  Musculoskeletal: Normal range of motion.  Neurological: He is alert and  oriented to person, place, and time.  Skin: Skin is warm and dry.  Psychiatric: He has a normal mood and affect. His behavior is normal.  Nursing note and vitals reviewed.   ED Course  Procedures (including critical care time)  DIAGNOSTIC STUDIES: Oxygen Saturation is 97% on RA, normal by my interpretation.    COORDINATION OF CARE:  11:20 AM Will order labs, ECG, and imaging.  Patient acknowledges and agrees with plan.    Labs Review Labs Reviewed  BASIC METABOLIC PANEL - Abnormal; Notable for the following:    Glucose, Bld 152 (*)     Calcium 8.7 (*)    All other components within normal limits  CBC  TROPONIN I    Imaging Review Dg Chest 2 View  05/15/2015   CLINICAL DATA:  Sharp anterior right chest pain for 2 hr.  EXAM: CHEST  2 VIEW  COMPARISON:  PA and lateral chest 10/04/2013 and 04/18/2013.  FINDINGS: The lungs are clear. Heart size is normal. No pneumothorax or pleural effusion.  IMPRESSION: No acute disease.   Electronically Signed   By: Drusilla Kanner M.D.   On: 05/15/2015 11:12   I have personally reviewed and evaluated these images and lab results as part of my medical decision-making.   EKG Interpretation   Date/Time:  Thursday May 15 2015 10:50:41 EDT Ventricular Rate:  66 PR Interval:  171 QRS Duration: 79 QT Interval:  406 QTC Calculation: 425 R Axis:   57 Text Interpretation:  Sinus rhythm Left atrial enlargement Confirmed by  Zaccheaus Storlie  MD, Kamden Reber ([id number]) on 05/15/2015 1:23:20 PM      MDM   Final diagnoses:  Chest pain, unspecified chest pain type    Patient presents with atypical chest pain. Screening labs, EKG, chest x-ray, troponin all negative. Discharge medication Percocet. Patient has primary care follow-up.  I personally performed the services described in this documentation, which was scribed in my presence. The recorded information has been reviewed and is accurate.    Donnetta Hutching, MD 05/15/15 1354  Donnetta Hutching, MD 05/15/15 [phone]

## 2016-06-28 ENCOUNTER — Ambulatory Visit: Payer: Self-pay | Admitting: Orthopedic Surgery

## 2016-06-28 ENCOUNTER — Encounter: Payer: Self-pay | Admitting: Orthopedic Surgery

## 2017-07-15 ENCOUNTER — Ambulatory Visit (HOSPITAL_COMMUNITY)
Admission: EM | Admit: 2017-07-15 | Discharge: 2017-07-15 | Disposition: A | Discharge status: Home / Self Care | Payer: Self-pay | Attending: Internal Medicine | Admitting: Internal Medicine

## 2017-07-15 ENCOUNTER — Encounter (HOSPITAL_COMMUNITY): Payer: Self-pay | Admitting: Family Medicine

## 2017-07-15 DIAGNOSIS — R519 Headache, unspecified: Secondary | ICD-10-CM

## 2017-07-15 DIAGNOSIS — M545 Low back pain: Secondary | ICD-10-CM

## 2017-07-15 DIAGNOSIS — R51 Headache: Secondary | ICD-10-CM

## 2017-07-15 DIAGNOSIS — G8929 Other chronic pain: Secondary | ICD-10-CM

## 2017-07-15 DIAGNOSIS — M25562 Pain in left knee: Secondary | ICD-10-CM

## 2017-07-15 MED ORDER — DEXAMETHASONE SODIUM PHOSPHATE 10 MG/ML IJ SOLN
INTRAMUSCULAR | Status: AC
  Filled 2017-07-15: qty 1

## 2017-07-15 MED ORDER — NAPROXEN 500 MG PO TABS: 500.0000 mg | ORAL_TABLET | Freq: Two times a day (BID) | ORAL | 0 refills | Status: DC

## 2017-07-15 MED ORDER — KETOROLAC TROMETHAMINE 60 MG/2ML IM SOLN
60.0000 mg | Freq: Once | INTRAMUSCULAR | Status: AC
  Administered 2017-07-15: 60 mg via INTRAMUSCULAR

## 2017-07-15 MED ORDER — DEXAMETHASONE SODIUM PHOSPHATE 10 MG/ML IJ SOLN
10.0000 mg | Freq: Once | INTRAMUSCULAR | Status: AC
  Administered 2017-07-15: 10 mg via INTRAMUSCULAR

## 2017-07-15 MED ORDER — KETOROLAC TROMETHAMINE 60 MG/2ML IM SOLN
INTRAMUSCULAR | Status: AC
  Filled 2017-07-15: qty 2

## 2017-07-15 NOTE — ED Provider Notes (Signed)
[facility]    CSN: [id number] Arrival date & time: 07/15/17  1750     History   Chief Complaint Chief Complaint  Patient presents with  . Knee Pain  . Back Pain  . Headache    HPI Ivan Murphy is a 56 y.o. male.   56 year old male with history of hypertension, cocaine abuse, cardiac catheterization with patent coronary arteries, comes in for 4 day history of intermittent headache that is pounding bilaterally. Worse when he first wakes up. Denies nausea, vomiting, photophobia, phonophobia. He admits to snoring. Drowsiness despite full night sleep. Ibuprofen and tylenol with good relief, but pain comes back. Was an every day smoker with 1ppd smoker for 2-3 years, stopped 3 weeks ago due to going into [employer] [facility] program. Increased caffeine intake. Denies alcohol use. Denies illicit drug use.   Chronic lower back pain and left knee pain. Denies past injury. Intermittent pain that can last few hours at a time. Can sometimes hear popping of the knee. Sometimes has had some back pain that radiates down to the leg with numbness/tingling. Does not take anything for it when it happens. States he just "wait it out".       Past Medical History:  Diagnosis Date  . Abnormal EKG, felt to be due to hypertension  02/28/2013  . Chest pain at rest 02/26/2013  . Headache(784.0)   . Hypertension   . S/P cardiac cath, 02/28/13 02/28/2013    Patient Active Problem List   Diagnosis Date Noted  . Fever 04/18/2013  . Dizziness 04/17/2013  . Acute streptococcal pharyngitis 02/28/2013  . S/P cardiac cath, 02/28/13, patent coronary arteries 02/28/2013  . Abnormal EKG, felt to be due to hypertension  02/28/2013  . Chest pain, non cardiac, may be due to hypertension 02/27/2013  . Headache 02/27/2013  . Cough 02/27/2013  . Tobacco abuse 02/27/2013  . Hx of cocaine abuse 02/27/2013    Past Surgical History:  Procedure Laterality Date  . CARDIAC CATHETERIZATION  02/28/2013  . IRRIGATION  AND DEBRIDEMENT ABSCESS    . LEFT HEART CATHETERIZATION WITH CORONARY ANGIOGRAM N/A 02/28/2013   Procedure: LEFT HEART CATHETERIZATION WITH CORONARY ANGIOGRAM;  Surgeon: Runell GessJonathan J Berry, MD;  [facility];  Service: Cardiovascular;  Laterality: N/A;       Home Medications    Prior to Admission medications   Medication Sig Start Date End Date Taking? Authorizing Provider  naproxen (NAPROSYN) 500 MG tablet Take 1 tablet (500 mg total) by mouth 2 (two) times daily with a meal. 07/15/17   Yu, Amy V, PA-C  naproxen sodium (ANAPROX) 220 MG tablet Take 440 mg by mouth 2 (two) times daily as needed (for pain).     [provider]  oxyCODONE-acetaminophen (PERCOCET/ROXICET) 5-325 MG per tablet Take 1-2 tablets by mouth every 6 (six) hours as needed. 05/15/15   Donnetta Hutchingook, Brian, MD  pantoprazole (PROTONIX) 40 MG tablet Take 1 tablet (40 mg total) by mouth daily. Patient not taking: Reported on 05/15/2015 01/21/15   Dione BoozeGlick, David, MD  traMADol (ULTRAM) 50 MG tablet Take 1 tablet (50 mg total) by mouth every 6 (six) hours as needed. Patient not taking: Reported on 05/15/2015 01/21/15   Dione BoozeGlick, David, MD    Family History Family History  Problem Relation Age of Onset  . Diabetes Brother     Social History Social History  Substance Use Topics  . Smoking status: Current Every Day Smoker    Packs/day: 0.50    Years:  39.00    Types: Cigarettes  . Smokeless tobacco: Never Used  . Alcohol use Yes     Allergies   Patient has no known allergies.   Review of Systems Review of Systems  Reason unable to perform ROS: See HPI as above.     Physical Exam Triage Vital Signs ED Triage Vitals  Enc Vitals Group     BP 07/15/17 1806 (!) 143/88     Pulse Rate 07/15/17 1806 64     Resp 07/15/17 1806 18     Temp 07/15/17 1806 98.6 F (37 C)     Temp src --      SpO2 07/15/17 1806 97 %     Weight --      Height --      Head Circumference --      Peak Flow --      Pain Score 07/15/17  1804 8     Pain Loc --      Pain Edu? --      Excl. in GC? --    No data found.   Updated Vital Signs BP (!) 143/88   Pulse 64   Temp 98.6 F (37 C)   Resp 18   SpO2 97%   Physical Exam  Constitutional: He is oriented to person, place, and time. He appears well-developed and well-nourished. No distress.  HENT:  Head: Normocephalic and atraumatic.  Right Ear: External ear normal.  Left Ear: External ear normal.  Mouth/Throat: Oropharynx is clear and moist.  Eyes: Pupils are equal, round, and reactive to light. Conjunctivae and EOM are normal.  Neck: Normal range of motion. Neck supple. No spinous process tenderness and no muscular tenderness present. Normal range of motion present.  Cardiovascular: Normal rate, regular rhythm and normal heart sounds.  Exam reveals no gallop and no friction rub.   No murmur heard. Pulmonary/Chest: Effort normal and breath sounds normal. He has no wheezes. He has no rales.  Musculoskeletal:  Tenderness on palpation of left medial joint line of the knee. No obvious tenderness on palpation of the back. Full passive range of motion of the left hip, decreased active range of motion due to back pain. Full range of motion of the knee. Strength normal and equal bilaterally. Sensation intact and equal bilaterally.  Neurological: He is alert and oriented to person, place, and time. He has normal strength. He is not disoriented. No cranial nerve deficit or sensory deficit. He displays a negative Romberg sign. GCS eye subscore is 4. GCS verbal subscore is 5. GCS motor subscore is 6.     UC Treatments / Results  Labs (all labs ordered are listed, but only abnormal results are displayed) Labs Reviewed - No data to display  EKG  EKG Interpretation None       Radiology No results found.  Procedures Procedures (including critical care time)  Medications Ordered in UC Medications  ketorolac (TORADOL) injection 60 mg (60 mg Intramuscular Given  07/15/17 1930)  dexamethasone (DECADRON) injection 10 mg (10 mg Intramuscular Given 07/15/17 1930)     Initial Impression / Assessment and Plan / UC Course  I have reviewed the triage vital signs and the nursing notes.  Pertinent labs & imaging results that were available during my care of the patient were reviewed by me and considered in my medical decision making (see chart for details).    Radiology exam normal without focal deficits. Discussed possible causes of headache, such as sleep apnea, stress, changes in  lifestyle, caffeine intake, nicotine withdrawal. Chronic knee and back pain without alarming signs. Given good relief on NSAIDs, Toradol and Decadron injection in office today. Patient to start naproxen as directed. Knee sleeve during activity. Patient to follow up with PCP for further evaluation and treatment of symptoms. Resources given. Return precautions given.  Final Clinical Impressions(s) / UC Diagnoses   Final diagnoses:  Acute intractable headache, unspecified headache type  Chronic bilateral low back pain without sciatica  Chronic pain of left knee    New Prescriptions Discharge Medication List as of 07/15/2017  7:31 PM    START taking these medications   Details  naproxen (NAPROSYN) 500 MG tablet Take 1 tablet (500 mg total) by mouth 2 (two) times daily with a meal., Starting Fri 07/15/2017, Normal          Yu, Amy V, PA-C 07/15/17 2053

## 2017-07-15 NOTE — ED Triage Notes (Signed)
Pt here for headache x 4 days taking tylenol and ibuprofen. Reports lower back pain and left knee pain that has been ongoing for a while.

## 2017-07-15 NOTE — Discharge Instructions (Signed)
Toradol and decadron injection in office today. Start naproxen as directed for pain and inflammation. Wear knee sleeve during activity. Ice compress and elevation. As we discussed, possible multiple causes of headache, such as stress, changes in life style, caffeine intake, quitting smoking cold [place], sleep apnea (snoring, fatigue, headache in the morning). You will need to follow up PCP for sleep study to rule out sleep apnea as a cause.   If experiencing worsening symptoms, blurry vision, nausea, vomiting, sensitivity to light, weakness, dizziness, chest pain, shortness of breath, go to the emergency department for further evaluation. If experiencing numbness/tingling of the inner thighs, loss of bladder/bowel control, go to the emergency department for further evaluation.

## 2017-07-17 ENCOUNTER — Encounter (HOSPITAL_COMMUNITY): Payer: Self-pay | Admitting: *Deleted

## 2017-07-17 ENCOUNTER — Emergency Department (HOSPITAL_COMMUNITY)
Admission: EM | Admit: 2017-07-17 | Discharge: 2017-07-17 | Disposition: A | Discharge status: Home / Self Care | Payer: Self-pay | Attending: Emergency Medicine | Admitting: Emergency Medicine

## 2017-07-17 ENCOUNTER — Emergency Department (HOSPITAL_COMMUNITY): Payer: Self-pay

## 2017-07-17 DIAGNOSIS — F17210 Nicotine dependence, cigarettes, uncomplicated: Secondary | ICD-10-CM | POA: Insufficient documentation

## 2017-07-17 DIAGNOSIS — I10 Essential (primary) hypertension: Secondary | ICD-10-CM | POA: Insufficient documentation

## 2017-07-17 DIAGNOSIS — F1410 Cocaine abuse, uncomplicated: Secondary | ICD-10-CM | POA: Insufficient documentation

## 2017-07-17 DIAGNOSIS — J181 Lobar pneumonia, unspecified organism: Secondary | ICD-10-CM

## 2017-07-17 DIAGNOSIS — J189 Pneumonia, unspecified organism: Secondary | ICD-10-CM | POA: Insufficient documentation

## 2017-07-17 LAB — COMPREHENSIVE METABOLIC PANEL
ALT: 16 U/L — AB (ref 17–63)
AST: 15 U/L (ref 15–41)
Albumin: 3.4 g/dL — ABNORMAL LOW (ref 3.5–5.0)
Alkaline Phosphatase: 56 U/L (ref 38–126)
Anion gap: 6 (ref 5–15)
BUN: 14 mg/dL (ref 6–20)
CALCIUM: 9 mg/dL (ref 8.9–10.3)
CHLORIDE: 102 mmol/L (ref 101–111)
CO2: 26 mmol/L (ref 22–32)
CREATININE: 0.98 mg/dL (ref 0.61–1.24)
Glucose, Bld: 101 mg/dL — ABNORMAL HIGH (ref 65–99)
Potassium: 4 mmol/L (ref 3.5–5.1)
Sodium: 134 mmol/L — ABNORMAL LOW (ref 135–145)
Total Bilirubin: 0.4 mg/dL (ref 0.3–1.2)
Total Protein: 7.4 g/dL (ref 6.5–8.1)

## 2017-07-17 LAB — CBC
HCT: 39.4 % (ref 39.0–52.0)
Hemoglobin: 13.5 g/dL (ref 13.0–17.0)
MCH: 28 pg (ref 26.0–34.0)
MCHC: 34.3 g/dL (ref 30.0–36.0)
MCV: 81.7 fL (ref 78.0–100.0)
PLATELETS: 194 10*3/uL (ref 150–400)
RBC: 4.82 MIL/uL (ref 4.22–5.81)
RDW: 14 % (ref 11.5–15.5)
WBC: 9.5 10*3/uL (ref 4.0–10.5)

## 2017-07-17 LAB — I-STAT TROPONIN, ED: TROPONIN I, POC: 0.01 ng/mL (ref 0.00–0.08)

## 2017-07-17 LAB — LIPASE, BLOOD: LIPASE: 30 U/L (ref 11–51)

## 2017-07-17 MED ORDER — ONDANSETRON HCL 4 MG PO TABS: 4.0000 mg | ORAL_TABLET | Freq: Four times a day (QID) | ORAL | 0 refills | Status: DC | PRN

## 2017-07-17 MED ORDER — DOXYCYCLINE HYCLATE 100 MG PO CAPS: 100.0000 mg | ORAL_CAPSULE | Freq: Two times a day (BID) | ORAL | 0 refills | Status: AC

## 2017-07-17 MED ORDER — ONDANSETRON 4 MG PO TBDP
4.0000 mg | ORAL_TABLET | Freq: Once | ORAL | Status: AC
  Administered 2017-07-17: 4 mg via ORAL
  Filled 2017-07-17: qty 1

## 2017-07-17 MED ORDER — IBUPROFEN 400 MG PO TABS
600.0000 mg | ORAL_TABLET | Freq: Once | ORAL | Status: AC
  Administered 2017-07-17: 600 mg via ORAL
  Filled 2017-07-17: qty 1

## 2017-07-17 NOTE — Discharge Instructions (Signed)
Please continue fluid hydration as tolerated. Please complete full course of prescribed antibiotics. Return for worsening chest or abdominal pain. Take ibuprofen/tylenol as needed for pain. Followup with primary doctor at first availability

## 2017-07-17 NOTE — ED Provider Notes (Signed)
[facility] EMERGENCY DEPARTMENT Provider Note   CSN: [id number] Arrival date & time: 07/17/17  1752     History   Chief Complaint No chief complaint on file.   HPI Ivan Murphy is a 56 y.o. male.  56 year old male history of HTN and tobacco abuse who presents with diffuse myalgias, cough, nausea, malaise, abdominal pain.  Onset 2-3 days ago.  Worsening over past evening.  Endorses productive cough with dark foul-smelling mucus.  Endorses diffuse abdominal pain and intermittent chest wall pain.  Denies known sick contacts.  No recent travel.  Describes decreased p.o. intake due to nausea.  Was seen 2 days ago at urgent care.  The history is provided by the patient and medical records. No language interpreter was used.    Past Medical History:  Diagnosis Date  . Abnormal EKG, felt to be due to hypertension  02/28/2013  . Chest pain at rest 02/26/2013  . Headache(784.0)   . Hypertension   . S/P cardiac cath, 02/28/13 02/28/2013    Patient Active Problem List   Diagnosis Date Noted  . Fever 04/18/2013  . Dizziness 04/17/2013  . Acute streptococcal pharyngitis 02/28/2013  . S/P cardiac cath, 02/28/13, patent coronary arteries 02/28/2013  . Abnormal EKG, felt to be due to hypertension  02/28/2013  . Chest pain, non cardiac, may be due to hypertension 02/27/2013  . Headache 02/27/2013  . Cough 02/27/2013  . Tobacco abuse 02/27/2013  . Hx of cocaine abuse 02/27/2013    Past Surgical History:  Procedure Laterality Date  . CARDIAC CATHETERIZATION  02/28/2013  . IRRIGATION AND DEBRIDEMENT ABSCESS    . LEFT HEART CATHETERIZATION WITH CORONARY ANGIOGRAM N/A 02/28/2013   Procedure: LEFT HEART CATHETERIZATION WITH CORONARY ANGIOGRAM;  Surgeon: Runell Gess, MD;  Location: [facility] CATH LAB;  Service: Cardiovascular;  Laterality: N/A;       Home Medications    Prior to Admission medications   Medication Sig Start Date End Date Taking? Authorizing Provider  ibuprofen  (ADVIL,MOTRIN) 200 MG tablet Take 400 mg by mouth every 6 (six) hours as needed for mild pain.   Yes [provider]  doxycycline (VIBRAMYCIN) 100 MG capsule Take 1 capsule (100 mg total) by mouth 2 (two) times daily. 07/17/17 07/24/17  Hebert Soho, MD  naproxen (NAPROSYN) 500 MG tablet Take 1 tablet (500 mg total) by mouth 2 (two) times daily with a meal. Patient not taking: Reported on 07/17/2017 07/15/17   Belinda Fisher, PA-C  ondansetron (ZOFRAN) 4 MG tablet Take 1 tablet (4 mg total) by mouth every 6 (six) hours as needed for nausea or vomiting. 07/17/17   Hebert Soho, MD  pantoprazole (PROTONIX) 40 MG tablet Take 1 tablet (40 mg total) by mouth daily. Patient not taking: Reported on 05/15/2015 01/21/15   Dione Booze, MD    Family History Family History  Problem Relation Age of Onset  . Diabetes Brother     Social History Social History  Substance Use Topics  . Smoking status: Current Every Day Smoker    Packs/day: 0.50    Years: 39.00    Types: Cigarettes  . Smokeless tobacco: Never Used  . Alcohol use Yes     Allergies   Patient has no known allergies.   Review of Systems Review of Systems  Constitutional: Positive for appetite change and fatigue. Negative for chills and fever.  HENT: Negative for ear pain and sore throat.   Eyes: Negative for pain and visual disturbance.  Respiratory:  Positive for cough. Negative for shortness of breath.   Cardiovascular: Positive for chest pain. Negative for palpitations.  Gastrointestinal: Positive for abdominal distention, diarrhea and nausea. Negative for abdominal pain and vomiting.  Genitourinary: Negative for dysuria and hematuria.  Musculoskeletal: Positive for myalgias. Negative for arthralgias and back pain.  Skin: Negative for color change and rash.  Neurological: Negative for seizures and syncope.  All other systems reviewed and are negative.    Physical Exam Updated Vital Signs BP (!) 147/96   Pulse 73   Temp  99.1 F (37.3 C) (Oral)   Resp 17   Ht 5\' 7"  (1.702 m)   Wt 81.6 kg (180 lb)   SpO2 93%   BMI 28.19 kg/m   Physical Exam  Constitutional: He appears well-developed.  HENT:  Head: Normocephalic and atraumatic.  Eyes: Conjunctivae are normal.  Neck: Neck supple.  Cardiovascular: Normal rate and regular rhythm.   No murmur heard. Pulmonary/Chest: Effort normal and breath sounds normal. No respiratory distress. He has no wheezes. He has no rales.  Abdominal: Soft. He exhibits no distension. There is no tenderness. There is no guarding.  Musculoskeletal: He exhibits no edema.  Neurological: He is alert. No cranial nerve deficit. Coordination normal.  Moves all extremities  Skin: Skin is warm and dry.  Nursing note and vitals reviewed.    ED Treatments / Results  Labs (all labs ordered are listed, but only abnormal results are displayed) Labs Reviewed  COMPREHENSIVE METABOLIC PANEL - Abnormal; Notable for the following:       Result Value   Sodium 134 (*)    Glucose, Bld 101 (*)    Albumin 3.4 (*)    ALT 16 (*)    All other components within normal limits  CBC  LIPASE, BLOOD  I-STAT TROPONIN, ED    EKG  EKG Interpretation None       Radiology Dg Chest 2 View  Result Date: 07/17/2017 CLINICAL DATA:  Nausea and chest pain. EXAM: CHEST  2 VIEW COMPARISON:  05/15/2015 FINDINGS: The heart size and mediastinal contours are within normal limits. Subtle airspace opacity in the superior segment of the left lower lobe consistent with pneumonia. There is subsegmental atelectasis at each lung base. No effusion or pneumothorax. The visualized skeletal structures are unremarkable. IMPRESSION: Subtle pneumonic consolidation in the superior segment of the left lower lobe. Electronically Signed   By: Tollie Eth M.D.   On: 07/17/2017 19:34    Procedures Procedures (including critical care time)  Medications Ordered in ED Medications  ondansetron (ZOFRAN-ODT) disintegrating  tablet 4 mg (4 mg Oral Given 07/17/17 2005)  ibuprofen (ADVIL,MOTRIN) tablet 600 mg (600 mg Oral Given 07/17/17 2005)     Initial Impression / Assessment and Plan / ED Course  I have reviewed the triage vital signs and the nursing notes.  Pertinent labs & imaging results that were available during my care of the patient were reviewed by me and considered in my medical decision making (see chart for details).     48 yoM who p/w diffuse myalgias, cough, nausea, malaise, abdominal pain. CXR showing subtle pneumonic consolidation in superior segment of LLL.  Will treat for CAP. Doxycyline rx provided. Pt ambulating well and tolerating PO.  Return precautions provided for worsening symptoms. Pt will f/u with PCP at first availability. Pt verbalized agreement with plan.  Pt care d/w Dr. Jacqulyn Bath  Final Clinical Impressions(s) / ED Diagnoses   Final diagnoses:  Community acquired pneumonia of left lower lobe  of lung [facility](HCC)    New Prescriptions Discharge Medication List as of 07/17/2017  8:55 PM    START taking these medications   Details  doxycycline (VIBRAMYCIN) 100 MG capsule Take 1 capsule (100 mg total) by mouth 2 (two) times daily., Starting Sun 07/17/2017, Until Sun 07/24/2017, Print    ondansetron (ZOFRAN) 4 MG tablet Take 1 tablet (4 mg total) by mouth every 6 (six) hours as needed for nausea or vomiting., Starting Sun 07/17/2017, Print         Hebert SohoMu, Kattia Selley, MD 07/18/17 [id number]    Maia PlanLong, Joshua G, MD 07/18/17 1105

## 2017-07-17 NOTE — ED Notes (Signed)
Pt verbalizes understanding of d/c instructions. Pt received prescriptions. Pt ambulatory at d/c with all belongings.  

## 2017-07-17 NOTE — ED Triage Notes (Signed)
Pt states epigastric pain that radiates to chest and L shoulder since Saturday and is accompanied by nausea and loss of appetite.

## 2017-07-21 ENCOUNTER — Encounter (INDEPENDENT_AMBULATORY_CARE_PROVIDER_SITE_OTHER): Payer: Self-pay | Admitting: Physician Assistant

## 2017-07-21 ENCOUNTER — Ambulatory Visit (INDEPENDENT_AMBULATORY_CARE_PROVIDER_SITE_OTHER): Payer: Self-pay | Admitting: Physician Assistant

## 2017-07-21 VITALS — BP 137/81 | HR 70 | Temp 99.0°F | Ht 67.72 in | Wt 185.2 lb

## 2017-07-21 DIAGNOSIS — R110 Nausea: Secondary | ICD-10-CM

## 2017-07-21 DIAGNOSIS — J189 Pneumonia, unspecified organism: Secondary | ICD-10-CM

## 2017-07-21 DIAGNOSIS — R51 Headache: Secondary | ICD-10-CM

## 2017-07-21 DIAGNOSIS — R519 Headache, unspecified: Secondary | ICD-10-CM

## 2017-07-21 DIAGNOSIS — J181 Lobar pneumonia, unspecified organism: Secondary | ICD-10-CM

## 2017-07-21 MED ORDER — NAPROXEN 500 MG PO TABS: 500.0000 mg | ORAL_TABLET | Freq: Two times a day (BID) | ORAL | 0 refills | Status: DC

## 2017-07-21 MED ORDER — ONDANSETRON HCL 4 MG PO TABS: 8.0000 mg | ORAL_TABLET | Freq: Three times a day (TID) | ORAL | 0 refills | Status: DC | PRN

## 2017-07-21 NOTE — Patient Instructions (Signed)
Community-Acquired Pneumonia, Adult °Pneumonia is an infection of the lungs. There are different types of pneumonia. One type can develop while a person is in a hospital. A different type, called community-acquired pneumonia, develops in people who are not, or have not recently been, in the hospital or other health care facility. °What are the causes? °Pneumonia may be caused by bacteria, viruses, or funguses. Community-acquired pneumonia is often caused by Streptococcus pneumonia bacteria. These bacteria are often passed from one person to another by breathing in droplets from the cough or sneeze of an infected person. °What increases the risk? °The condition is more likely to develop in: °· People who have chronic diseases, such as chronic obstructive pulmonary disease (COPD), asthma, congestive heart failure, cystic fibrosis, diabetes, or kidney disease. °· People who have early-stage or late-stage HIV. °· People who have sickle cell disease. °· People who have had their spleen removed (splenectomy). °· People who have poor dental hygiene. °· People who have medical conditions that increase the risk of breathing in (aspirating) secretions their own mouth and nose. °· People who have a weakened immune system (immunocompromised). °· People who smoke. °· People who travel to areas where pneumonia-causing germs commonly exist. °· People who are around animal habitats or animals that have pneumonia-causing germs, including birds, bats, rabbits, cats, and farm animals. ° °What are the signs or symptoms? °Symptoms of this condition include: °· A dry cough. °· A wet (productive) cough. °· Fever. °· Sweating. °· Chest pain, especially when breathing deeply or coughing. °· Rapid breathing or difficulty breathing. °· Shortness of breath. °· Shaking chills. °· Fatigue. °· Muscle aches. ° °How is this diagnosed? °Your health care provider will take a medical history and perform a physical exam. You may also have other tests,  including: °· Imaging studies of your chest, including X-rays. °· Tests to check your blood oxygen level and other blood gases. °· Other tests on blood, mucus (sputum), fluid around your lungs (pleural fluid), and urine. ° °If your pneumonia is severe, other tests may be done to identify the specific cause of your illness. °How is this treated? °The type of treatment that you receive depends on many factors, such as the cause of your pneumonia, the medicines you take, and other medical conditions that you have. For most adults, treatment and recovery from pneumonia may occur at home. In some cases, treatment must happen in a hospital. Treatment may include: °· Antibiotic medicines, if the pneumonia was caused by bacteria. °· Antiviral medicines, if the pneumonia was caused by a virus. °· Medicines that are given by mouth or through an IV tube. °· Oxygen. °· Respiratory therapy. ° °Although rare, treating severe pneumonia may include: °· Mechanical ventilation. This is done if you are not breathing well on your own and you cannot maintain a safe blood oxygen level. °· Thoracentesis. This procedure removes fluid around one lung or both lungs to help you breathe better. ° °Follow these instructions at home: °· Take over-the-counter and prescription medicines only as told by your health care provider. °? Only take cough medicine if you are losing sleep. Understand that cough medicine can prevent your body’s natural ability to remove mucus from your lungs. °? If you were prescribed an antibiotic medicine, take it as told by your health care provider. Do not stop taking the antibiotic even if you start to feel better. °· Sleep in a semi-upright position at night. Try sleeping in a reclining chair, or place a few pillows under your head. °· Do not use tobacco products, including cigarettes, chewing   tobacco, and e-cigarettes. If you need help quitting, ask your health care provider. °· Drink enough water to keep your urine  clear or pale yellow. This will help to thin out mucus secretions in your lungs. °How is this prevented? °There are ways that you can decrease your risk of developing community-acquired pneumonia. Consider getting a pneumococcal vaccine if: °· You are older than 56 years of age. °· You are older than 56 years of age and are undergoing cancer treatment, have chronic lung disease, or have other medical conditions that affect your immune system. Ask your health care provider if this applies to you. ° °There are different types and schedules of pneumococcal vaccines. Ask your health care provider which vaccination option is best for you. °You may also prevent community-acquired pneumonia if you take these actions: °· Get an influenza vaccine every year. Ask your health care provider which type of influenza vaccine is best for you. °· Go to the dentist on a regular basis. °· Wash your hands often. Use hand sanitizer if soap and water are not available. ° °Contact a health care provider if: °· You have a fever. °· You are losing sleep because you cannot control your cough with cough medicine. °Get help right away if: °· You have worsening shortness of breath. °· You have increased chest pain. °· Your sickness becomes worse, especially if you are an older adult or have a weakened immune system. °· You cough up blood. °This information is not intended to replace advice given to you by your health care provider. Make sure you discuss any questions you have with your health care provider. °Document Released: 09/13/2005 Document Revised: 01/22/2016 Document Reviewed: 01/08/2015 °Elsevier Interactive Patient Education © 2017 [employer] ° °

## 2017-07-21 NOTE — Progress Notes (Signed)
Subjective:  Patient ID: Ivan Murphy, male    DOB: 1960-11-25  Age: 56 y.o. MRN: [id number]  CC: nausea and headache  HPI KRIS NO is a 56 y.o. male with a medical history of PNA, HA, CP, HTN, s/p cardiac cath 02/28/13, and tobacco abuse. Went to the ED four days ago with complaint of diffuse myalgias, cough, nausea, malaise, and abdominal pain. Symptoms resolved except for nasal congestion, generalized headache, and nausea. Was prescribed Doxycycline and Zofran but only began to take yesterday. Zofran only moderately effective for nausea. Does not endorse any other symptoms or complaints.       Outpatient Medications Prior to Visit  Medication Sig Dispense Refill  . doxycycline (VIBRAMYCIN) 100 MG capsule Take 1 capsule (100 mg total) by mouth 2 (two) times daily. 14 capsule 0  . ibuprofen (ADVIL,MOTRIN) 200 MG tablet Take 400 mg by mouth every 6 (six) hours as needed for mild pain.    Marland Kitchen ondansetron (ZOFRAN) 4 MG tablet Take 1 tablet (4 mg total) by mouth every 6 (six) hours as needed for nausea or vomiting. 12 tablet 0  . naproxen (NAPROSYN) 500 MG tablet Take 1 tablet (500 mg total) by mouth 2 (two) times daily with a meal. (Patient not taking: Reported on 07/17/2017) 30 tablet 0  . pantoprazole (PROTONIX) 40 MG tablet Take 1 tablet (40 mg total) by mouth daily. (Patient not taking: Reported on 05/15/2015) 15 tablet 0   No facility-administered medications prior to visit.      ROS Review of Systems  Constitutional: Negative for chills, fever and malaise/fatigue.  HENT: Positive for congestion.   Eyes: Negative for blurred vision.  Respiratory: Negative for shortness of breath.   Cardiovascular: Negative for chest pain and palpitations.  Gastrointestinal: Positive for nausea. Negative for abdominal pain, blood in stool, constipation, diarrhea, melena and vomiting.  Genitourinary: Negative for dysuria and hematuria.  Musculoskeletal: Negative for joint pain and myalgias.   Skin: Negative for rash.  Neurological: Positive for headaches. Negative for tingling.  Psychiatric/Behavioral: Negative for depression. The patient is not nervous/anxious.     Objective:  BP 137/81 (BP Location: Left Arm, Patient Position: Sitting, Cuff Size: Normal)   Pulse 70   Temp 99 F (37.2 C) (Oral)   Ht 5' 7.72" (1.72 m)   Wt 185 lb 3.2 oz (84 kg)   SpO2 94%   BMI 28.40 kg/m   BP/Weight 07/21/2017 07/17/2017 07/15/2017  Systolic BP 137 147 143  Diastolic BP 81 96 88  Wt. (Lbs) 185.2 180 -  BMI 28.4 28.19 -      Physical Exam  Constitutional: He is oriented to person, place, and time.  Well developed, well nourished, NAD  HENT:  Head: Normocephalic and atraumatic.  TMs normal. Turbinates mildly hypertrophic. Mild postnasal drip, no oropharyngeal exudates.  Eyes: Conjunctivae are normal. No scleral icterus.  Neck: Normal range of motion. Neck supple. No thyromegaly present.  Cardiovascular: Normal rate, regular rhythm and normal heart sounds.   Pulmonary/Chest: Effort normal. No respiratory distress. He has no wheezes. He has rales (left lower lobe).  Musculoskeletal: He exhibits no edema.  Lymphadenopathy:    He has no cervical adenopathy.  Neurological: He is alert and oriented to person, place, and time. No cranial nerve deficit. Coordination normal.  Skin: Skin is warm and dry. No rash noted. No erythema. No pallor.  Psychiatric: He has a normal mood and affect. His behavior is normal. Thought content normal.  Vitals reviewed.  Assessment & Plan:    1. Pneumonia of left lower lobe due to infectious organism [facility](HCC) - Continue to take Doxycycline to completion. He has only taken one full day of abx thus far.  2. Nonintractable headache, unspecified chronicity pattern, unspecified headache type - Begin naproxen (NAPROSYN) 500 MG tablet; Take 1 tablet (500 mg total) by mouth 2 (two) times daily with a meal.  Dispense: 30 tablet; Refill: 0 - Advised to  hydrate properly  3. Nausea without vomiting - Increase ondansetron (ZOFRAN) 4 MG tablet; Take 2 tablets (8 mg total) by mouth every 8 (eight) hours as needed for nausea or vomiting.  Dispense: 12 tablet; Refill: 0   Meds ordered this encounter  Medications  . ondansetron (ZOFRAN) 4 MG tablet    Sig: Take 2 tablets (8 mg total) by mouth every 8 (eight) hours as needed for nausea or vomiting.    Dispense:  12 tablet    Refill:  0    Order Specific Question:   Supervising Provider    Answer:   Quentin AngstJEGEDE, OLUGBEMIGA E [id number]  . naproxen (NAPROSYN) 500 MG tablet    Sig: Take 1 tablet (500 mg total) by mouth 2 (two) times daily with a meal.    Dispense:  30 tablet    Refill:  0    Order Specific Question:   Supervising Provider    Answer:   Quentin AngstJEGEDE, OLUGBEMIGA E [[id number]]    Follow-up: Return in about 2 weeks (around 08/04/2017) for full physical.   Loletta Specteroger David Gomez PA

## 2017-08-05 ENCOUNTER — Ambulatory Visit (INDEPENDENT_AMBULATORY_CARE_PROVIDER_SITE_OTHER): Payer: Self-pay | Admitting: Physician Assistant

## 2019-05-19 ENCOUNTER — Encounter (HOSPITAL_COMMUNITY): Payer: Self-pay

## 2019-05-19 ENCOUNTER — Other Ambulatory Visit: Payer: Self-pay

## 2019-05-19 ENCOUNTER — Emergency Department (HOSPITAL_COMMUNITY)
Admission: EM | Admit: 2019-05-19 | Discharge: 2019-05-20 | Disposition: A | Discharge status: Other Acute Inpatient Hospital | Payer: BC Managed Care – PPO | Source: Home / Self Care | Attending: Emergency Medicine | Admitting: Emergency Medicine

## 2019-05-19 DIAGNOSIS — Z20828 Contact with and (suspected) exposure to other viral communicable diseases: Secondary | ICD-10-CM | POA: Insufficient documentation

## 2019-05-19 DIAGNOSIS — F1414 Cocaine abuse with cocaine-induced mood disorder: Secondary | ICD-10-CM | POA: Diagnosis not present

## 2019-05-19 DIAGNOSIS — F329 Major depressive disorder, single episode, unspecified: Secondary | ICD-10-CM | POA: Diagnosis not present

## 2019-05-19 DIAGNOSIS — R45851 Suicidal ideations: Secondary | ICD-10-CM | POA: Insufficient documentation

## 2019-05-19 DIAGNOSIS — Z008 Encounter for other general examination: Secondary | ICD-10-CM | POA: Insufficient documentation

## 2019-05-19 DIAGNOSIS — Z79899 Other long term (current) drug therapy: Secondary | ICD-10-CM | POA: Insufficient documentation

## 2019-05-19 DIAGNOSIS — F322 Major depressive disorder, single episode, severe without psychotic features: Secondary | ICD-10-CM | POA: Insufficient documentation

## 2019-05-19 DIAGNOSIS — I10 Essential (primary) hypertension: Secondary | ICD-10-CM | POA: Insufficient documentation

## 2019-05-19 DIAGNOSIS — F1420 Cocaine dependence, uncomplicated: Secondary | ICD-10-CM | POA: Insufficient documentation

## 2019-05-19 DIAGNOSIS — F17210 Nicotine dependence, cigarettes, uncomplicated: Secondary | ICD-10-CM | POA: Insufficient documentation

## 2019-05-19 LAB — RAPID URINE DRUG SCREEN, HOSP PERFORMED
Amphetamines: NOT DETECTED
Barbiturates: NOT DETECTED
Benzodiazepines: NOT DETECTED
Cocaine: POSITIVE — AB
Opiates: NOT DETECTED
Tetrahydrocannabinol: NOT DETECTED

## 2019-05-19 LAB — COMPREHENSIVE METABOLIC PANEL
ALT: 18 U/L (ref 0–44)
AST: 18 U/L (ref 15–41)
Albumin: 3.6 g/dL (ref 3.5–5.0)
Alkaline Phosphatase: 56 U/L (ref 38–126)
Anion gap: 11 (ref 5–15)
BUN: 11 mg/dL (ref 6–20)
CO2: 21 mmol/L — ABNORMAL LOW (ref 22–32)
Calcium: 8.6 mg/dL — ABNORMAL LOW (ref 8.9–10.3)
Chloride: 108 mmol/L (ref 98–111)
Creatinine, Ser: 0.78 mg/dL (ref 0.61–1.24)
GFR calc Af Amer: >60 mL/min (ref >60)
GFR calc non Af Amer: >60 mL/min (ref >60)
Glucose, Bld: 95 mg/dL (ref 70–99)
Potassium: 3.6 mmol/L (ref 3.5–5.1)
Sodium: 140 mmol/L (ref 135–145)
Total Bilirubin: 0.9 mg/dL (ref 0.3–1.2)
Total Protein: 7.2 g/dL (ref 6.5–8.1)

## 2019-05-19 LAB — CBC WITH DIFFERENTIAL/PLATELET
Abs Immature Granulocytes: 0.02 10*3/uL (ref 0.00–0.07)
Basophils Absolute: 0 10*3/uL (ref 0.0–0.1)
Basophils Relative: 0 %
Eosinophils Absolute: 0.2 10*3/uL (ref 0.0–0.5)
Eosinophils Relative: 2 %
HCT: 44.2 % (ref 39.0–52.0)
Hemoglobin: 14.5 g/dL (ref 13.0–17.0)
Immature Granulocytes: 0 %
Lymphocytes Relative: 17 %
Lymphs Abs: 1.6 10*3/uL (ref 0.7–4.0)
MCH: 27.4 pg (ref 26.0–34.0)
MCHC: 32.8 g/dL (ref 30.0–36.0)
MCV: 83.6 fL (ref 80.0–100.0)
Monocytes Absolute: 1 10*3/uL (ref 0.1–1.0)
Monocytes Relative: 11 %
Neutro Abs: 6.7 10*3/uL (ref 1.7–7.7)
Neutrophils Relative %: 70 %
Platelets: 188 10*3/uL (ref 150–400)
RBC: 5.29 MIL/uL (ref 4.22–5.81)
RDW: 14.6 % (ref 11.5–15.5)
WBC: 9.6 10*3/uL (ref 4.0–10.5)
nRBC: 0 % (ref 0.0–0.2)

## 2019-05-19 LAB — URINALYSIS, ROUTINE W REFLEX MICROSCOPIC
Bilirubin Urine: NEGATIVE
Glucose, UA: NEGATIVE mg/dL
Hgb urine dipstick: NEGATIVE
Ketones, ur: 20 mg/dL — AB
Leukocytes,Ua: NEGATIVE
Nitrite: NEGATIVE
Protein, ur: NEGATIVE mg/dL
Specific Gravity, Urine: 1.019 (ref 1.005–1.030)
pH: 6 (ref 5.0–8.0)

## 2019-05-19 LAB — ETHANOL: Alcohol, Ethyl (B): <10 mg/dL (ref <10)

## 2019-05-19 LAB — SARS CORONAVIRUS 2 BY RT PCR (HOSPITAL ORDER, PERFORMED IN ~~LOC~~ HOSPITAL LAB): SARS Coronavirus 2: NEGATIVE

## 2019-05-19 NOTE — BH Assessment (Signed)
[facility] Assessment Progress Note    Per Priscille Loveless, NP, Inpatient Treatment is recommended.  [facility] reviewing for admission, but staffing and acuity may prevent patient from being admitted until the am shift if he is reviewed and is appropriate for admission.

## 2019-05-19 NOTE — Progress Notes (Signed)
Per [facility], [facility] will have a bed on 05/20/19. Vivien Rota, RN advised pt tentatively accepted to [facility] on 05/20/19. Pt will need a Covid test prior to transport.  Lind Covert, MSW, LCSW Therapeutic Triage Specialist  [phone]

## 2019-05-19 NOTE — ED Provider Notes (Signed)
[facility] Provider Note   CSN: [id number] Arrival date & time: 05/19/19  8527     History   Chief Complaint Chief Complaint  Patient presents with  . V70.1  . Addiction Problem  . Suicidal    HPI Ivan Murphy is a 58 y.o. male.     HPI   Ivan Murphy is a 58 y.o. male who presents to the Emergency Department requesting help with cocaine.  He states that he has been snorting crack cocaine "for a lot of years" and he wants to stop.  He last used cocaine this morning.  He also reports drinking beer and alcohol on occasion.  He reports a plan of taking pills to kill himself.  He denies pain, vomiting, chest pain, and shortness of breath.  He denies homicidal thoughts or plans.      Past Medical History:  Diagnosis Date  . Abnormal EKG, felt to be due to hypertension  02/28/2013  . Chest pain at rest 02/26/2013  . Headache(784.0)   . Hypertension   . S/P cardiac cath, 02/28/13 02/28/2013    Patient Active Problem List   Diagnosis Date Noted  . Fever 04/18/2013  . Dizziness 04/17/2013  . Acute streptococcal pharyngitis 02/28/2013  . S/P cardiac cath, 02/28/13, patent coronary arteries 02/28/2013  . Abnormal EKG, felt to be due to hypertension  02/28/2013  . Chest pain, non cardiac, may be due to hypertension 02/27/2013  . Headache 02/27/2013  . Cough 02/27/2013  . Tobacco abuse 02/27/2013  . Hx of cocaine abuse ([place]) 02/27/2013    Past Surgical History:  Procedure Laterality Date  . CARDIAC CATHETERIZATION  02/28/2013  . IRRIGATION AND DEBRIDEMENT ABSCESS    . LEFT HEART CATHETERIZATION WITH CORONARY ANGIOGRAM N/A 02/28/2013   Procedure: LEFT HEART CATHETERIZATION WITH CORONARY ANGIOGRAM;  Surgeon: Lorretta Harp, MD;  Location: [facility] CATH LAB;  Service: Cardiovascular;  Laterality: N/A;        Home Medications    Prior to Admission medications   Medication Sig Start Date End Date Taking? Authorizing Provider  naproxen (NAPROSYN) 500 MG tablet  Take 1 tablet (500 mg total) by mouth 2 (two) times daily with a meal. 07/21/17   Clent Demark, PA-C  ondansetron (ZOFRAN) 4 MG tablet Take 2 tablets (8 mg total) by mouth every 8 (eight) hours as needed for nausea or vomiting. 07/21/17   Clent Demark, PA-C    Family History Family History  Problem Relation Age of Onset  . Diabetes Brother     Social History Social History   Tobacco Use  . Smoking status: Current Every Day Smoker    Packs/day: 0.50    Years: 39.00    Pack years: 19.50    Types: Cigarettes  . Smokeless tobacco: Never Used  Substance Use Topics  . Alcohol use: Yes  . Drug use: Yes    Types: Cocaine    Comment: last used 05/19/2019     Allergies   Patient has no known allergies.   Review of Systems Review of Systems  Constitutional: Negative for chills and fever.  HENT: Negative for trouble swallowing.   Respiratory: Negative for chest tightness and shortness of breath.   Cardiovascular: Negative for chest pain and palpitations.  Gastrointestinal: Negative for abdominal pain, nausea and vomiting.  Genitourinary: Negative for dysuria and flank pain.  Musculoskeletal: Negative for arthralgias, back pain and neck pain.  Skin: Negative for rash.  Neurological: Negative for dizziness, syncope, weakness  and headaches.  Psychiatric/Behavioral: Positive for suicidal ideas. Negative for decreased concentration. The patient is not nervous/anxious.      Physical Exam Updated Vital Signs BP (!) 150/93 (BP Location: Right Arm)   Pulse 72   Temp 98.3 F (36.8 C)   Resp 14   Ht 5\' 7"  (1.702 m)   Wt 93 kg   SpO2 97%   BMI 32.11 kg/m   Physical Exam Vitals signs and nursing note reviewed.  Constitutional:      General: He is not in acute distress.    Appearance: He is well-developed.     Comments: Pt is very somnolent  HENT:     Head: Normocephalic.     Mouth/Throat:     Mouth: Mucous membranes are moist.  Eyes:     Extraocular Movements:  Extraocular movements intact.     Pupils: Pupils are equal, round, and reactive to light.  Neck:     Musculoskeletal: Normal range of motion and neck supple.  Cardiovascular:     Rate and Rhythm: Normal rate and regular rhythm.     Pulses: Normal pulses.  Pulmonary:     Effort: Pulmonary effort is normal. No respiratory distress.     Breath sounds: Normal breath sounds.  Chest:     Chest wall: No tenderness.  Abdominal:     General: There is no distension.     Palpations: Abdomen is soft.     Tenderness: There is no abdominal tenderness.  Musculoskeletal: Normal range of motion.        General: No tenderness.  Lymphadenopathy:     Cervical: No cervical adenopathy.  Skin:    General: Skin is warm and dry.  Neurological:     General: No focal deficit present.     Mental Status: He is alert and oriented to person, place, and time.     Sensory: No sensory deficit.     Motor: No weakness or abnormal muscle tone.     Coordination: Coordination normal.  Psychiatric:        Mood and Affect: Mood normal.        Speech: Speech normal.        Behavior: Behavior normal. Behavior is cooperative.        Thought Content: Thought content is not delusional. Thought content includes suicidal ideation. Thought content does not include homicidal ideation. Thought content includes suicidal plan. Thought content does not include homicidal plan.      ED Treatments / Results  Labs (all labs ordered are listed, but only abnormal results are displayed) Labs Reviewed  COMPREHENSIVE METABOLIC PANEL - Abnormal; Notable for the following components:      Result Value   CO2 21 (*)    Calcium 8.6 (*)    All other components within normal limits  URINALYSIS, ROUTINE W REFLEX MICROSCOPIC - Abnormal; Notable for the following components:   Ketones, ur 20 (*)    Bacteria, UA RARE (*)    All other components within normal limits  RAPID URINE DRUG SCREEN, HOSP PERFORMED - Abnormal; Notable for the  following components:   Cocaine POSITIVE (*)    All other components within normal limits  CBC WITH DIFFERENTIAL/PLATELET  ETHANOL    EKG None    Radiology No results found.  Procedures Procedures (including critical care time)  Medications Ordered in ED Medications - No data to display   Initial Impression / Assessment and Plan / ED Course  I have reviewed the triage vital signs  and the nursing notes.  Pertinent labs & imaging results that were available during my care of the patient were reviewed by me and considered in my medical decision making (see chart for details).        Pt here requesting help with cocaine and having thoughts of suicide.  He is somnolent and cooperative.  Will obtain labs and consult TTS.     UDS positive for cocaine.  Pt remains cooperative.  TTS consult still pending.  Sitter at bedside.  Discussed with Dr. Fredderick SeveranceZackoski at end of shift.    Final Clinical Impressions(s) / ED Diagnoses   Final diagnoses:  None    ED Discharge Orders    None       Rosey Bathriplett, Gavriela Cashin, PA-C 05/19/19 1713    Bethann BerkshireZammit, Joseph, MD 05/21/19 1630

## 2019-05-19 NOTE — BH Assessment (Signed)
Tele Assessment Note   Patient Name: Ivan Murphy MRN: [id number] Referring Physician: Pauline Ausammy Triplett Location of Patient: [facility] Location of Provider: Behavioral Health TTS Department  Ivan Murphy is an 58 y.o. male who presented to [facility] with suicidal ideation to overdose on motrin. Patient states that he was at [facility] and states that he was clean for over two years, but states that he relapsed because he left the program because there were bed bugs at the facility.  Patient states that when he left there that he had no stable place to live and states that he has been staying with friends and in his truck.  He states that going back to [place] was not a good thing for him to do.  He states that he paid child support on a child for fifteen years that was not his and he even had to go to jail for non-payment of child support and he states that the child's mother will not let him stay there or even take a shower there and his resentment triggers him to use cocaine.  Patient states that he has been using since 1986.  He states that most recently he has been using a couple days a week.  He states that when he gets paid, he gets triggered to use and states that he uses all of his pay to buy cocaine.  Patient states that he has never been suicidal before, but states that today that he was so depressed that he thought about overdosing on a bottle of motrin that he had in his truck.  Patient states that he really does not want to die, but he was so depressed that he cried for two hours while he was on the phone with his cousin.  Patient states that he has no homicidal thoughts and states that he has no psychosis.  Patient states that he averages 2-3 hours of sleep per night and states that he has not been eating and states that he has lost twenty pounds.  Patient states that he has no history of abuse or self-mutilation.  Patient states that he has an extensive legal history and states that he is  currently on probation and states that he feels like he is going to be  Revoked on his probation if he continues to use.  Patient states that he needs to get some help so that he can start his recovery process all over again.  Patient presented with a depressed mood and flat affect.  His memory was in tact and his thoughts organized.  He was oriented and alert.  His judgment, insight and impulse control were impaired due to his drug use.  He did not appear to be responding to internal stimuli.  His speech was clear and coherent, but he spoke loudly, his eye contact was fair.  His psycho-motor activity was unremarkable.  Diagnosis: F32.2 MDD Single episode Severe / F14.20 Cocaine Use Disorder Severe  Past Medical History:  Past Medical History:  Diagnosis Date  . Abnormal EKG, felt to be due to hypertension  02/28/2013  . Chest pain at rest 02/26/2013  . Headache(784.0)   . Hypertension   . S/P cardiac cath, 02/28/13 02/28/2013    Past Surgical History:  Procedure Laterality Date  . CARDIAC CATHETERIZATION  02/28/2013  . IRRIGATION AND DEBRIDEMENT ABSCESS    . LEFT HEART CATHETERIZATION WITH CORONARY ANGIOGRAM N/A 02/28/2013   Procedure: LEFT HEART CATHETERIZATION WITH CORONARY ANGIOGRAM;  Surgeon: Runell GessJonathan J Berry, MD;  Location: [facility] CATH [facility];  Service: Cardiovascular;  Laterality: N/A;    Family History:  Family History  Problem Relation Age of Onset  . Diabetes Brother     Social History:  reports that he has been smoking cigarettes. He has a 19.50 pack-year smoking history. He has never used smokeless tobacco. He reports current alcohol use. He reports current drug use. Drug: Cocaine.  Additional Social History:  Alcohol / Drug Use Pain Medications: see MAR Prescriptions: see MAR Over the Counter: see MAR History of alcohol / drug use?: Yes Longest period of sobriety (when/how long): none reported Negative Consequences of Use: Financial, Personal relationships, Work / School Substance  #1 Name of Substance 1: cocaine 1 - Age of First Use: 1986 1 - Amount (size/oz): $400 weelky 1 - Frequency: 2-3 days a week 1 - Duration: since 1986, but was clean from 2017 to 2020 1 - Last Use / Amount: $00 in the past couple days  CIWA: CIWA-Ar BP: (!) 150/93 Pulse Rate: 72 COWS:    Allergies: No Known Allergies  Home Medications: (Not in a hospital admission)   OB/GYN Status:  No LMP for male patient.  General Assessment Data Assessment unable to be completed: Yes Reason for not completing assessment: (patient is unable to stay awake) Location of Assessment: [facility] TTS Assessment: In system Is this a Tele or Face-to-Face Assessment?: Tele Assessment Is this an Initial Assessment or a Re-assessment for this encounter?: Initial Assessment Patient Accompanied by:: N/A Language Other than English: No Living Arrangements: Homeless/Shelter What gender do you identify as?: Male Marital status: Single Living Arrangements: Alone(staying with friends and in his truck) Can pt return to current living arrangement?: No Admission Status: Voluntary Is patient capable of signing voluntary admission?: Yes Referral Source: Self/Family/Friend Insurance type: Herbalist(BCBS)     Crisis Care Plan Living Arrangements: Alone(staying with friends and in his truck) Armed forces operational officerLegal Guardian: Other:(self) Name of Psychiatrist: none Name of Therapist: none  Education Status Is patient currently in school?: No Is the patient employed, unemployed or receiving disability?: Unemployed  Risk to self with the past 6 months Suicidal Ideation: Yes-Currently Present Has patient been a risk to self within the past 6 months prior to admission? : No Suicidal Intent: No Has patient had any suicidal intent within the past 6 months prior to admission? : No Is patient at risk for suicide?: Yes Suicidal Plan?: Yes-Currently Present Has patient had any suicidal plan within the past 6 months prior to admission? :  No Specify Current Suicidal Plan: (overdose on Motrin) Access to Means: Yes Specify Access to Suicidal Means: (has a bottle of motrin) What has been your use of drugs/alcohol within the last 12 months?: cocaine use twice weekly Previous Attempts/Gestures: No How many times?: 0 Other Self Harm Risks: (homeless and minimal support) Triggers for Past Attempts: None known Intentional Self Injurious Behavior: None Family Suicide History: No Recent stressful life event(s): Conflict (Comment), Legal Issues Persecutory voices/beliefs?: No Depression: Yes Depression Symptoms: Despondent, Isolating, Guilt, Loss of interest in usual pleasures, Feeling worthless/self pity Substance abuse history and/or treatment for substance abuse?: Yes Suicide prevention information given to non-admitted patients: Not applicable  Risk to Others within the past 6 months Homicidal Ideation: No Does patient have any lifetime risk of violence toward others beyond the six months prior to admission? : No Thoughts of Harm to Others: No Current Homicidal Intent: No Current Homicidal Plan: No Access to Homicidal Means: No Identified Victim: none History of harm to others?:  No Assessment of Violence: None Noted Violent Behavior Description: none Does patient have access to weapons?: No Criminal Charges Pending?: No Does patient have a court date: No Is patient on probation?: No  Psychosis Hallucinations: None noted Delusions: None noted  Mental Status Report Appearance/Hygiene: Unremarkable Eye Contact: Fair Motor Activity: Freedom of movement Speech: Logical/coherent, Loud Level of Consciousness: Alert Mood: Depressed, Apathetic Affect: Flat Anxiety Level: Minimal Thought Processes: Coherent, Relevant Judgement: Impaired Orientation: Person, Place, Time, Situation Obsessive Compulsive Thoughts/Behaviors: Severe(obsession to use cocaine)  Cognitive Functioning Concentration: Decreased Memory: Recent  Intact, Remote Intact Is patient IDD: No Insight: Poor Impulse Control: Poor Appetite: Poor Have you had any weight changes? : Loss Amount of the weight change? (lbs): 20 lbs Sleep: Decreased Total Hours of Sleep: (2-3) Vegetative Symptoms: Decreased grooming  ADLScreening [facility] Assessment Services) Patient's cognitive ability adequate to safely complete daily activities?: Yes Patient able to express need for assistance with ADLs?: Yes Independently performs ADLs?: Yes (appropriate for developmental age)  Prior Inpatient Therapy Prior Inpatient Therapy: Yes Prior Therapy Dates: (2017-2020) Prior Therapy Facilty/Provider(s): [place] Reason for Treatment: (cocaine)  Prior Outpatient Therapy Prior Outpatient Therapy: No Does patient have an ACCT team?: No Does patient have Intensive In-House Services?  : No Does patient have [facility] services? : No Does patient have P4CC services?: No  ADL Screening (condition at time of admission) Patient's cognitive ability adequate to safely complete daily activities?: Yes Is the patient deaf or have difficulty hearing?: No Does the patient have difficulty seeing, even when wearing glasses/contacts?: No Does the patient have difficulty concentrating, remembering, or making decisions?: No Patient able to express need for assistance with ADLs?: Yes Does the patient have difficulty dressing or bathing?: No Independently performs ADLs?: Yes (appropriate for developmental age) Does the patient have difficulty walking or climbing stairs?: No Weakness of Legs: None Weakness of Arms/Hands: None  Home Assistive Devices/Equipment Home Assistive Devices/Equipment: None  Therapy Consults (therapy consults require a physician order) PT Evaluation Needed: No OT Evalulation Needed: No SLP Evaluation Needed: No Abuse/Neglect Assessment (Assessment to be complete while patient is alone) Abuse/Neglect Assessment Can Be Completed: Yes Physical  Abuse: Denies Verbal Abuse: Denies Sexual Abuse: Denies Exploitation of patient/patient's resources: Denies Self-Neglect: Denies Values / Beliefs Cultural Requests During Hospitalization: None Spiritual Requests During Hospitalization: None Consults Spiritual Care Consult Needed: No Social Work Consult Needed: No Regulatory affairs officer (For Healthcare) Does Patient Have a Medical Advance Directive?: No Would patient like information on creating a medical advance directive?: No - Patient declined Nutrition Screen- [facility] Has the patient recently lost weight without trying?: Yes, 14-23 lbs. Has the patient been eating poorly because of a decreased appetite?: No Malnutrition Screening Tool Score: 2        Disposition: Per Priscille Loveless, NP, Inpatient Treatment is recommended Disposition Initial Assessment Completed for this Encounter: Yes  This service was provided via telemedicine using a 2-way, interactive audio and video technology.  Names of all persons participating in this telemedicine service and their role in this encounter. Name: Ivan Murphy Role: patient  Name: Kasandra Knudsen Susie Ehresman Role: TTS  Name:  Role:   Name:  Role:     Reatha Armour 05/19/2019 6:29 PM

## 2019-05-19 NOTE — BH Assessment (Signed)
[place] Assessment Progress Note    Attempted to assess patient, but was unable to keep him awake.  Please call [phone] when patient is more alert and can be assessed.

## 2019-05-19 NOTE — ED Triage Notes (Signed)
Pt is wanting to stop doing drugs. He states he smokes crack cocaine and last use was this morning. Pt also states he wants to hurt himself. Wanted to take a whole bottle of pills to kill himself.

## 2019-05-20 ENCOUNTER — Other Ambulatory Visit: Payer: Self-pay

## 2019-05-20 ENCOUNTER — Inpatient Hospital Stay (HOSPITAL_COMMUNITY)
Admission: AD | Admit: 2019-05-20 | Discharge: 2019-05-21 | DRG: 897 | Disposition: A | Discharge status: Home / Self Care | Payer: BC Managed Care – PPO | Source: Intra-hospital | Attending: Psychiatry | Admitting: Psychiatry

## 2019-05-20 ENCOUNTER — Encounter (HOSPITAL_COMMUNITY): Payer: Self-pay

## 2019-05-20 DIAGNOSIS — G4700 Insomnia, unspecified: Secondary | ICD-10-CM | POA: Diagnosis present

## 2019-05-20 DIAGNOSIS — F17210 Nicotine dependence, cigarettes, uncomplicated: Secondary | ICD-10-CM | POA: Diagnosis present

## 2019-05-20 DIAGNOSIS — F1414 Cocaine abuse with cocaine-induced mood disorder: Secondary | ICD-10-CM | POA: Diagnosis present

## 2019-05-20 DIAGNOSIS — R45851 Suicidal ideations: Secondary | ICD-10-CM | POA: Diagnosis present

## 2019-05-20 DIAGNOSIS — Z833 Family history of diabetes mellitus: Secondary | ICD-10-CM | POA: Diagnosis not present

## 2019-05-20 DIAGNOSIS — F1424 Cocaine dependence with cocaine-induced mood disorder: Secondary | ICD-10-CM

## 2019-05-20 DIAGNOSIS — F329 Major depressive disorder, single episode, unspecified: Secondary | ICD-10-CM | POA: Diagnosis present

## 2019-05-20 DIAGNOSIS — I10 Essential (primary) hypertension: Secondary | ICD-10-CM | POA: Diagnosis present

## 2019-05-20 DIAGNOSIS — Z20828 Contact with and (suspected) exposure to other viral communicable diseases: Secondary | ICD-10-CM | POA: Diagnosis present

## 2019-05-20 MED ORDER — MAGNESIUM HYDROXIDE 400 MG/5ML PO SUSP: 30.0000 mL | Freq: Every day | ORAL | Status: DC | PRN

## 2019-05-20 MED ORDER — ALUM & MAG HYDROXIDE-SIMETH 200-200-20 MG/5ML PO SUSP: 30.0000 mL | ORAL | Status: DC | PRN

## 2019-05-20 MED ORDER — HYDROXYZINE HCL 25 MG PO TABS
25.0000 mg | ORAL_TABLET | Freq: Three times a day (TID) | ORAL | Status: DC | PRN
  Filled 2019-05-20: qty 1

## 2019-05-20 MED ORDER — ACETAMINOPHEN 325 MG PO TABS: 650.0000 mg | ORAL_TABLET | Freq: Four times a day (QID) | ORAL | Status: DC | PRN

## 2019-05-20 NOTE — Tx Team (Signed)
Initial Treatment Plan 05/20/2019 2:46 PM Ivan Murphy VVK:[id number]    PATIENT STRESSORS: Financial difficulties Substance abuse   PATIENT STRENGTHS: Capable of independent living Communication skills Supportive family/friends   PATIENT IDENTIFIED PROBLEMS:      "tired of the drugs"     " depression"              DISCHARGE CRITERIA:  Improved stabilization in mood, thinking, and/or behavior Motivation to continue treatment in a less acute level of care Reduction of life-threatening or endangering symptoms to within safe limits  PRELIMINARY DISCHARGE PLAN: Outpatient therapy Return to previous living arrangement  PATIENT/FAMILY INVOLVEMENT: This treatment plan has been presented to and reviewed with the patient, Ivan Murphy,  The patient has been given the opportunity to ask questions and make suggestions.  Waymond Cera, RN 05/20/2019, 2:46 PM

## 2019-05-20 NOTE — Consult Note (Addendum)
Ivan Murphy reassess via tele-assessment.  He was requesting a discharge this morning.  Patient is now recanted his suicidal ideations.  This NP attempted to obtain collateral, however contact information received for [phone] [name] is unable to provide history or attest to patient's wellbeing.  She stated she is a landlord " I do not know much about him."  Patient was receptive to inpatient admission.  Patient has been accepted to [facility] behavioral health.  See chart.  Support, encouragement and  reassurance was provided.

## 2019-05-20 NOTE — Progress Notes (Signed)
Per Ivan Murphy, pt has been accepted to [facility] bed 301-2. Accepting provider is T. Bobby Rumpf, NP. Attending provider is Dr. Parke Poisson, MD. Patient can arrive by 11:00. Number for report is [phone]. CSW spoke with Ivan Homes, RN regarding disposition.  Ivan Murphy. Ivan Murphy, MSW, Ivan Murphy/Disposition Phone: [phone] Fax: [phone]

## 2019-05-20 NOTE — Progress Notes (Signed)
Pt has been resting with eyes closed, respirations even and unlabored since beginning of shift.  Will continue to monitor for safety.  Pt remains safe on the unit.

## 2019-05-20 NOTE — BHH Suicide Risk Assessment (Signed)
[facility] Admission Suicide Risk Assessment   Nursing information obtained from:   patient and chart  Demographic factors:   58 y old male, single, lives alone, employed Current Mental Status:   see below Loss Factors:   cocaine abuse  Historical Factors:   history of cocaine use disorder Risk Reduction Factors:   resilience, employed   Total Time spent with patient: 45 minutes Principal Problem: Cocaine Abuse, Cocaine Induced Mood Disorder  Diagnosis:  Cocaine Abuse, Cocaine Induced Mood Disorder  Subjective Data:   Continued Clinical Symptoms:    The "Alcohol Use Disorders Identification Test", Guidelines for Use in Primary Care, Second Edition.  World Pharmacologist [facility]). Score between 0-7:  no or low risk or alcohol related problems. Score between 8-15:  moderate risk of alcohol related problems. Score between 16-19:  high risk of alcohol related problems. Score 20 or above:  warrants further diagnostic evaluation for alcohol dependence and treatment.   CLINICAL FACTORS:  58 year old male, presented to ED reporting on 8/22 in AM reporting cocaine abuse, suicidal ideations with thoughts of overdosing. He has a history of cocaine abuse, and states has used several times over recent weeks. Currently states that at the time he came to ED he was having a " bad day" and  feeling upset due to relapse/cocaine use, but describes as isolated, short lived symptoms in the context of drug use, and  denies recent depression , neuro-vegetative symptoms of depression leading up to admission or having had  suicidal ideations other than on day of admission. Denies history of prior psychiatric admissions or of severe depressive episodes or of suicide attempts.  At this time presents calm, future oriented, hopeful for discharge soon in order to return to work, as he feels that being out of work longer will jeopardize his job  Psychiatric Specialty Exam: Physical Exam  ROS  Blood pressure 124/75, pulse  84, temperature 98.7 F (37.1 C), temperature source Oral, resp. rate 16, height 5\' 7"  (1.702 m), weight 85.7 kg, SpO2 99 %.Body mass index is 29.6 kg/m.  See admit note MSE                                                        COGNITIVE FEATURES THAT CONTRIBUTE TO RISK:  Closed-mindedness and Loss of executive function    SUICIDE RISK:   Moderate:  Frequent suicidal ideation with limited intensity, and duration, some specificity in terms of plans, no associated intent, good self-control, limited dysphoria/symptomatology, some risk factors present, and identifiable protective factors, including available and accessible social support.  PLAN OF CARE: Patient will be admitted to inpatient psychiatric unit for stabilization and safety. Will provide and encourage milieu participation. Provide medication management and maked adjustments as needed.  Will follow daily.    I certify that inpatient services furnished can reasonably be expected to improve the patient's condition.   Jenne Campus, MD 05/20/2019, 4:44 PM

## 2019-05-20 NOTE — Progress Notes (Signed)
Patient is a 58 year old male who presented voluntarily to [facility] with SI having a plan to OD on motrin. Pt has since recanted, stating "I want to live." Pt reports that at the time he was "tired of the drugs". Pt reports that he had been to [facility] twice, and knows what he "has to do". Pt voices concern over missing work tomorrow.  Pt presents today with an anxious affect/ mood, calm, cooperative behavior- answered questions logically and coherently- appears to be somewhat hard of hearing. VS obtained. Skin assessment revealed no abnormalities. Pt currently denies SI/HI and A/V hallucinations. Admission paperwork completed and signed- verbal understanding expressed. Belongings searched and secured in locker. Patient oriented to unit.

## 2019-05-20 NOTE — H&P (Addendum)
Psychiatric Admission Assessment Adult  Patient Identification: Ivan Murphy MRN:  [id number] Date of Evaluation:  05/20/2019 Chief Complaint:   " I made a mistake and I said something but I don't want to die" Principal Diagnosis: Cocaine Use Disorder, Substance Induced Mood Disorder, Suicidal Ideations Diagnosis:  Cocaine Use Disorder, Substance Induced Mood Disorder, Suicidal Ideations History of Present Illness: 58 year old male, presented to ED on 8/22 reporting cocaine abuse,requesting help for substance abuse, and expressing depression, suicidal ideation with thoughts of overdosing. He had been living at [facility], a sober living setting, and had left about two weeks prior , after which he had relapsed on cocaine, following two years of sobriety. At this time patient minimizes depression and denies SI, states " I feel I made a mistake, I really was never going to kill myself or hurt myself, I love myself and I don't want to die". States that at the time he came in to ED he was  " upset with myself because I had been out using cocaine and I was frustrated ". Currently he denies depression or neuro-vegetative symptoms of depression and states " it was just a bad day, but I haven't been depressed ".  At this time patient is future oriented and hopeful for discharge soon, as he states he needs to return to work soon or his job will be in jeopardy.  Associated Signs/Symptoms: Depression Symptoms: currently denies persistent sadness or anhedonia. Also states sleep, appetite and energy level have been within normal. Denies having had any recent suicidal ideations. (Hypo) Manic Symptoms:  None noted or endorsed Anxiety Symptoms: does not endorse  Psychotic Symptoms:  PTSD Symptoms: Denies  Total Time spent with patient: 45 minutes  Past Psychiatric History: no prior psychiatric admissions . Denies any history of suicide attempts or of self cutting. Reports history of crack cocaine abuse,  states has used several times over the last 2-3 weeks but denies daily use . Denies history of depression other than with regards to substance abuse . Denies history of mania. Denies history of psychosis, denies history of mania or hypomania, denies history of violence   Is the patient at risk to self? Yes.    Has the patient been a risk to self in the past 6 months? No.  Has the patient been a risk to self within the distant past? No.  Is the patient a risk to others? No.  Has the patient been a risk to others in the past 6 months? No.  Has the patient been a risk to others within the distant past? No.   Prior Inpatient Therapy:  denies  Prior Outpatient Therapy:  none   Alcohol Screening:   Substance Abuse History in the last 12 months:  Reports he drinks about 1-2 beers 2-3 times a week.Denies past history of alcohol abuse . Denies other drug abuse .  Consequences of Substance Abuse: Denies  Previous Psychotropic Medications: reports he was not taking any psychiatric medications prior to admission and has never been on psychiatric medications in the past. Psychological Evaluations:  No  Past Medical History: Denies medical illnesses . At this time denies HTN.  ( BP today 124/75). States he was not taking any medications prior to admission.  Past Medical History:  Diagnosis Date  . Abnormal EKG, felt to be due to hypertension  02/28/2013  . Chest pain at rest 02/26/2013  . Headache(784.0)   . Hypertension   . S/P cardiac cath, 02/28/13 02/28/2013  Past Surgical History:  Procedure Laterality Date  . CARDIAC CATHETERIZATION  02/28/2013  . IRRIGATION AND DEBRIDEMENT ABSCESS    . LEFT HEART CATHETERIZATION WITH CORONARY ANGIOGRAM N/A 02/28/2013   Procedure: LEFT HEART CATHETERIZATION WITH CORONARY ANGIOGRAM;  Surgeon: Runell GessJonathan J Berry, MD;  [facility] LAB;  Service: Cardiovascular;  Laterality: N/A;   Family History: parents deceased, mother died in 162015 from " old age", father died 30  years ago from unknown causes. Has one brother.  Family History  Problem Relation Age of Onset  . Diabetes Brother    Family Psychiatric  History: denies history of mental illness in family, an aunt was alcoholic, denies history of suicides in family Tobacco Screening:  smokes 5 cigarettes per day.  Social History: [age], single, has a 518 year old daughter, [facility] for [facility] + year. States he left a few weeks ago, and is now living alone . He is  currently employed . Social History   Substance and Sexual Activity  Alcohol Use Yes     Social History   Substance and Sexual Activity  Drug Use Yes  . Types: Cocaine   Comment: last used 05/19/2019    Additional Social History:  Allergies:  No Known Allergies Lab Results:  Results for orders placed or performed during the hospital encounter of 05/19/19 (from the past 48 hour(s))  Urinalysis, Routine w reflex microscopic     Status: Abnormal   Collection Time: 05/19/19 10:46 AM  Result Value Ref Range   Color, Urine YELLOW YELLOW   APPearance CLEAR CLEAR   Specific Gravity, Urine 1.019 1.005 - 1.030   pH 6.0 5.0 - 8.0   Glucose, UA NEGATIVE NEGATIVE mg/dL   Hgb urine dipstick NEGATIVE NEGATIVE   Bilirubin Urine NEGATIVE NEGATIVE   Ketones, ur 20 (A) NEGATIVE mg/dL   Protein, ur NEGATIVE NEGATIVE mg/dL   Nitrite NEGATIVE NEGATIVE   Leukocytes,Ua NEGATIVE NEGATIVE   WBC, UA 0-5 0 - 5 WBC/hpf   Bacteria, UA RARE (A) NONE SEEN   Mucus PRESENT     Comment: Performed at [facility], [address] [place], [place] [address]  Urine rapid drug screen (hosp performed)     Status: Abnormal   Collection Time: 05/19/19 10:46 AM  Result Value Ref Range   Opiates NONE DETECTED NONE DETECTED   Cocaine POSITIVE (A) NONE DETECTED   Benzodiazepines NONE DETECTED NONE DETECTED   Amphetamines NONE DETECTED NONE DETECTED   Tetrahydrocannabinol NONE DETECTED NONE DETECTED   Barbiturates NONE DETECTED NONE  DETECTED    Comment: (NOTE) DRUG SCREEN FOR MEDICAL PURPOSES ONLY.  IF CONFIRMATION IS NEEDED FOR ANY PURPOSE, NOTIFY LAB WITHIN 5 DAYS. LOWEST DETECTABLE LIMITS FOR URINE DRUG SCREEN Drug Class                     Cutoff (ng/mL) Amphetamine and metabolites    1000 Barbiturate and metabolites    200 Benzodiazepine                 200 Tricyclics and metabolites     300 Opiates and metabolites        300 Cocaine and metabolites        300 THC                            50 Performed at [facility], [address] [place], [place] [address]  CBC with Differential     Status: None   Collection Time: 05/19/19 11:05 AM  Result Value Ref Range   WBC 9.6 4.0 - 10.5 K/uL   RBC 5.29 4.22 - 5.81 MIL/uL   Hemoglobin 14.5 13.0 - 17.0 g/dL   HCT 40.944.2 81.139.0 - 91.452.0 %   MCV 83.6 80.0 - 100.0 fL   MCH 27.4 26.0 - 34.0 pg   MCHC 32.8 30.0 - 36.0 g/dL   RDW 78.214.6 95.611.5 - 21.315.5 %   Platelets 188 150 - 400 K/uL   nRBC 0.0 0.0 - 0.2 %   Neutrophils Relative % 70 %   Neutro Abs 6.7 1.7 - 7.7 K/uL   Lymphocytes Relative 17 %   Lymphs Abs 1.6 0.7 - 4.0 K/uL   Monocytes Relative 11 %   Monocytes Absolute 1.0 0.1 - 1.0 K/uL   Eosinophils Relative 2 %   Eosinophils Absolute 0.2 0.0 - 0.5 K/uL   Basophils Relative 0 %   Basophils Absolute 0.0 0.0 - 0.1 K/uL   Immature Granulocytes 0 %   Abs Immature Granulocytes 0.02 0.00 - 0.07 K/uL    Comment: Performed at [facility], [address] [place], [place] [address]  Comprehensive metabolic panel     Status: Abnormal   Collection Time: 05/19/19 11:05 AM  Result Value Ref Range   Sodium 140 135 - 145 mmol/L   Potassium 3.6 3.5 - 5.1 mmol/L   Chloride 108 98 - 111 mmol/L   CO2 21 (L) 22 - 32 mmol/L   Glucose, Bld 95 70 - 99 mg/dL   BUN 11 6 - 20 mg/dL   Creatinine, Ser 8.460.78 0.61 - 1.24 mg/dL   Calcium 8.6 (L) 8.9 - 10.3 mg/dL   Total Protein 7.2 6.5 - 8.1 g/dL   Albumin 3.6 3.5 - 5.0 g/dL   AST 18 15 - 41 U/L   ALT 18 0 - 44 U/L   Alkaline  Phosphatase 56 38 - 126 U/L   Total Bilirubin 0.9 0.3 - 1.2 mg/dL   GFR calc non Af Amer >60 >60 mL/min   GFR calc Af Amer >60 >60 mL/min   Anion gap 11 5 - 15    Comment: Performed at [facility], [address] [place], [place] [address]  Ethanol     Status: None   Collection Time: 05/19/19 11:05 AM  Result Value Ref Range   Alcohol, Ethyl (B) <10 <10 mg/dL    Comment: (NOTE) Lowest detectable limit for serum alcohol is 10 mg/dL. For medical purposes only. Performed at [facility], [address] [place], [place] [address]   SARS Coronavirus 2 [facility](Hospital order, Performed in [facility] lab) Nasopharyngeal Nasopharyngeal Swab     Status: None   Collection Time: 05/19/19  9:00 PM   Specimen: Nasopharyngeal Swab  Result Value Ref Range   SARS Coronavirus 2 NEGATIVE NEGATIVE    Comment: (NOTE) If result is NEGATIVE SARS-CoV-2 target nucleic acids are NOT DETECTED. The SARS-CoV-2 RNA is generally detectable in upper and lower  respiratory specimens during the acute phase of infection. The lowest  concentration of SARS-CoV-2 viral copies this assay can detect is 250  copies / mL. A negative result does not preclude SARS-CoV-2 infection  and should not be used as the sole basis for treatment or other  patient management decisions.  A negative result may occur with  improper specimen collection / handling, submission of specimen other  than nasopharyngeal swab, presence of viral mutation(s)  within the  areas targeted by this assay, and inadequate number of viral copies  (<250 copies / mL). A negative result must be combined with clinical  observations, patient history, and epidemiological information. If result is POSITIVE SARS-CoV-2 target nucleic acids are DETECTED. The SARS-CoV-2 RNA is generally detectable in upper and lower  respiratory specimens dur ing the acute phase of infection.  Positive  results are indicative of active infection with SARS-CoV-2.  Clinical   correlation with patient history and other diagnostic information is  necessary to determine patient infection status.  Positive results do  not rule out bacterial infection or co-infection with other viruses. If result is PRESUMPTIVE POSTIVE SARS-CoV-2 nucleic acids MAY BE PRESENT.   A presumptive positive result was obtained on the submitted specimen  and confirmed on repeat testing.  While 2019 novel coronavirus  (SARS-CoV-2) nucleic acids may be present in the submitted sample  additional confirmatory testing may be necessary for epidemiological  and / or clinical management purposes  to differentiate between  SARS-CoV-2 and other Sarbecovirus currently known to infect humans.  If clinically indicated additional testing with an alternate test  methodology [phone] is advised. The SARS-CoV-2 RNA is generally  detectable in upper and lower respiratory sp ecimens during the acute  phase of infection. The expected result is Negative. Fact Sheet for Patients:  BoilerBrush.com.cyhttps://www.fda.gov/media/136312/download Fact Sheet for Healthcare Providers: https://pope.com/https://www.fda.gov/media/136313/download This test is not yet approved or cleared by the [place] FDA and has been authorized for detection and/or diagnosis of SARS-CoV-2 by FDA under an Emergency Use Authorization (EUA).  This EUA will remain in effect (meaning this test can be used) for the duration of the COVID-19 declaration under Section 564(b)(1) of the Act, 21 U.S.C. section 360bbb-3(b)(1), unless the authorization is terminated or revoked sooner. Performed at [facility], [address] [place], [place] [address]     Blood Alcohol level:  Lab Results  Component Value Date   [facility]10 05/19/2019    Metabolic Disorder Labs:  Lab Results  Component Value Date   HGBA1C 5.5 02/27/2013   MPG 111 02/27/2013   No results found for: PROLACTIN Lab Results  Component Value Date   CHOL 152 04/18/2013   TRIG 67 04/18/2013   HDL 47  04/18/2013   CHOLHDL 3.2 04/18/2013   VLDL 13 04/18/2013   LDLCALC 92 04/18/2013   LDLCALC 124 (H) 02/27/2013    Current Medications: Current Facility-Administered Medications  Medication Dose Route Frequency Provider Last Rate Last Dose  . acetaminophen (TYLENOL) tablet 650 mg  650 mg Oral Q6H PRN Oneta RackLewis, Tanika N, NP      . alum & mag hydroxide-simeth (MAALOX/MYLANTA) 200-200-20 MG/5ML suspension 30 mL  30 mL Oral Q4H PRN Oneta RackLewis, Tanika N, NP      . hydrOXYzine (ATARAX/VISTARIL) tablet 25 mg  25 mg Oral TID PRN Oneta RackLewis, Tanika N, NP      . magnesium hydroxide (MILK OF MAGNESIA) suspension 30 mL  30 mL Oral Daily PRN Oneta RackLewis, Tanika N, NP       PTA Medications: Medications Prior to Admission  Medication Sig Dispense Refill Last Dose  . naproxen (NAPROSYN) 500 MG tablet Take 1 tablet (500 mg total) by mouth 2 (two) times daily with a meal. (Patient not taking: Reported on 05/20/2019) 30 tablet 0   . ondansetron (ZOFRAN) 4 MG tablet Take 2 tablets (8 mg total) by mouth every 8 (eight) hours as needed for nausea or vomiting. (Patient not taking: Reported on 05/20/2019) 12 tablet  0     Musculoskeletal: Strength & Muscle Tone: within normal limits Gait & Station: normal Patient leans: N/A  Psychiatric Specialty Exam: Physical Exam  Review of Systems  Constitutional: Negative.  Negative for chills and fever.  HENT: Negative.   Eyes: Negative.   Respiratory: Negative for cough and wheezing.   Cardiovascular: Negative for chest pain.  Gastrointestinal: Negative.  Negative for diarrhea, nausea and vomiting.  Genitourinary: Negative.   Musculoskeletal: Negative.   Skin: Negative.  Negative for rash.  Neurological: Negative for seizures and headaches.  Endo/Heme/Allergies: Negative.   Psychiatric/Behavioral: Positive for substance abuse and suicidal ideas.  All other systems reviewed and are negative.   Blood pressure 124/75, pulse 84, temperature 98.7 F (37.1 C), temperature source Oral,  resp. rate 16, height 5\' 7"  (1.702 m), weight 85.7 kg, SpO2 99 %.Body mass index is 29.6 kg/m.  General Appearance: Fairly Groomed  Eye Contact:  Fair  Speech:  Normal Rate  Volume:  Normal  Mood:  reports " my mood is good today"  Affect:  Appropriate  Thought Process:  Linear and Descriptions of Associations: Intact  Orientation:  Other:  recent and remote grossly intact   Thought Content:  no hallucinations, no delusions, not internally preoccupied   Suicidal Thoughts:  No denies suicidal or self injurious ideations, denies homicidal or violent ideations, contracts for safety on unit  Homicidal Thoughts:  No  Memory:  recent and remote grossly intact   Judgement:  Fair  Insight:  Fair  Psychomotor Activity:  Normal- no restlessness or agitation  Concentration:  Concentration: Good and Attention Span: Good  Recall:  Good  Fund of Knowledge:  Good  Language:  Good  Akathisia:  Negative  Handed:  Right  AIMS (if indicated):     Assets:  Desire for Improvement Resilience  ADL's:  Intact  Cognition:  WNL  Sleep:       Treatment Plan Summary: Daily contact with patient to assess and evaluate symptoms and progress in treatment, Medication management, Plan inpatient treatment and medications as below  Observation Level/Precautions:  15 minute checks  Laboratory:  As needed   Psychotherapy: milieu, group therapy    Medications:  Patient reports his mood is now normalized and that he does not think he needs to be on any standing psychiatric medications Vistaril PRN for anxiety, Trazodone PRN for insomnia  Consultations: as needed   Discharge Concerns:  -  Estimated LOS: 2-3 days   Other:     Physician Treatment Plan for Primary Diagnosis: Cocaine Use Disorder  Long Term Goal(s): Improvement in symptoms so as ready for discharge  Short Term Goals: Ability to identify changes in lifestyle to reduce recurrence of condition will improve and Ability to identify triggers associated  with substance abuse/mental health issues will improve  Physician Treatment Plan for Secondary Diagnosis: Substance Induced Mood Disorder Long Term Goal(s): Improvement in symptoms so as ready for discharge  Short Term Goals: Ability to identify changes in lifestyle to reduce recurrence of condition will improve, Ability to verbalize feelings will improve, Ability to disclose and discuss suicidal ideas, Ability to demonstrate self-control will improve, Ability to identify and develop effective coping behaviors will improve and Ability to maintain clinical measurements within normal limits will improve  I certify that inpatient services furnished can reasonably be expected to improve the patient's condition.    Craige Cotta, MD 8/23/20204:08 PM

## 2019-05-20 NOTE — Progress Notes (Signed)
Patient is new to unit, CSW attempted to complete PSA with patient. Patient requested CSW come back at a later time, to allow him to take a shower.  CSW will follow up.  Stephanie Acre, LCSW-A Clinical Social Worker

## 2019-05-20 NOTE — ED Notes (Addendum)
Patient expressed desire to discharge. Patient is voluntary currently. Contacted [place].  [facility] telepsych machine in room. Patient TSS for reassessment.

## 2019-05-21 DIAGNOSIS — F1424 Cocaine dependence with cocaine-induced mood disorder: Secondary | ICD-10-CM

## 2019-05-21 LAB — TSH: TSH: 0.592 u[IU]/mL (ref 0.350–4.500)

## 2019-05-21 MED ORDER — ENSURE ENLIVE PO LIQD: 237.0000 mL | ORAL | Status: DC

## 2019-05-21 MED ORDER — ADULT MULTIVITAMIN W/MINERALS CH
1.0000 | ORAL_TABLET | Freq: Every day | ORAL | Status: DC
  Administered 2019-05-21: 1 via ORAL
  Filled 2019-05-21 (×3): qty 1

## 2019-05-21 NOTE — [facility] Suicide Risk Assessment (Signed)
[facility] Discharge Suicide Risk Assessment   Principal Problem: Adjustment disorder Discharge Diagnoses: Active Problems:   MDD (major depressive disorder)   Total Time spent with patient: 45 minutes  Musculoskeletal: Strength & Muscle Tone: within normal limits Gait & Station: normal Patient leans: N/A  Psychiatric Specialty Exam: ROS  Blood pressure (!) 144/92, pulse 72, temperature 98.2 F (36.8 C), resp. rate 16, height 5\' 7"  (1.702 m), weight 85.7 kg, SpO2 99 %.Body mass index is 29.6 kg/m.  General Appearance: Casual  Eye Contact::  Good  Speech:  Clear and Coherent409  Volume:  Normal  Mood:  Euthymic  Affect:  Full Range  Thought Process:  Coherent and Descriptions of Associations: Intact  Orientation:  Full (Time, Place, and Person)  Thought Content:  Logical  Suicidal Thoughts:  No  Homicidal Thoughts:  No  Memory:  Immediate;   Good Recent;   Good Remote;   Good  Judgement:  Good  Insight:  Good  Psychomotor Activity:  Normal  Concentration:  Good  Recall:  Good  Fund of Knowledge:Good  Language: Good  Akathisia:  Negative  Handed:  Right  AIMS (if indicated):     Assets:  Communication Skills Desire for Improvement Financial Resources/Insurance Housing Resilience Social Support  Sleep:  Number of Hours: 6.75  Cognition: WNL  ADL's:  Intact   Mental Status Per Nursing Assessment::   On Admission:  Suicidal ideation indicated by others  Demographic Factors:  Male  Loss Factors: NA  Historical Factors: NA  Risk Reduction Factors:   Positive social support  Continued Clinical Symptoms:  Patient states he was talking with his sister he was feeling lonely but those feelings of past he states he is baseline now he denies any suicidal thoughts plans or intent.  He is eager to get back to work, he is employed highly functioning and eager to get going.  Again no acute suicidal thoughts plans or intent states he is ready to get on with his  life  Cognitive Features That Contribute To Risk:  None    Suicide Risk:  Minimal: No identifiable suicidal ideation.  Patients presenting with no risk factors but with morbid ruminations; may be classified as minimal risk based on the severity of the depressive symptoms  Follow-up Information    PATIENT DECLINED ANY OUTPATIENT REFERRALS Follow up.   Why: PATIENT DECLINED ANY OUTPATIENT REFERRALS Contact information: PATIENT DECLINED ANY OUTPATIENT REFERRALS          Plan Of Care/Follow-up recommendations:  Activity:  full  Yakir Wenke, MD 05/21/2019, 10:25 AM

## 2019-05-21 NOTE — BHH Suicide Risk Assessment (Signed)
[facility] Discharge Suicide Risk Assessment   Principal Problem: Cocaine Use Disorder, Cocaine Induced Mood Disorder  Discharge Diagnoses: Active Problems:   MDD (major depressive disorder)   Total Time spent with patient: 30 minutes  Musculoskeletal: Strength & Muscle Tone: within normal limits Gait & Station: normal Patient leans: N/A  Psychiatric Specialty Exam: ROS denies headache, no chest pain, no shortness of breath, no nausea, no vomiting, no fever, no chills  Blood pressure (!) 144/92, pulse 72, temperature 98.2 F (36.8 C), resp. rate 16, height 5\' 7"  (1.702 m), weight 85.7 kg, SpO2 99 %.Body mass index is 29.6 kg/m.  General Appearance: Well Groomed  Eye Contact::  Good  Speech:  Normal Rate409  Volume:  Normal  Mood:  Denies depression and presents euthymic  Affect:  Appropriate and Full Range  Thought Process:  Linear and Descriptions of Associations: Intact  Orientation:  Full (Time, Place, and Person)  Thought Content:  No hallucinations, no delusions  Suicidal Thoughts:  No denies any suicidal or self-injurious ideations, presents future oriented  Homicidal Thoughts:  No  Memory:  Recent and remote grossly intact  Judgement:  Other:  Improving  Insight:  Fair/improving  Psychomotor Activity:  Normal-no restlessness or agitation  Concentration:  Good  Recall:  Good  Fund of Knowledge:Good  Language: Good  Akathisia:  Negative  Handed:  Right  AIMS (if indicated):     Assets:  Communication Skills Desire for Improvement Resilience  Sleep:  Number of Hours: 6.75  Cognition: WNL  ADL's:  Intact   Mental Status Per Nursing Assessment::   On Admission:  Suicidal ideation indicated by others  Demographic Factors:  32, single, has a 49 year old daughter, had been living in [facility] for 1 + year. States he left a few weeks ago, and is now living alone . He is  currently employed .  Loss Factors: Relapse  Historical Factors: No  prior psychiatric admissions, history of cocaine abuse, describes history of depression in the context of substance abuse ( substance induced )   Risk Reduction Factors:   Employed and Positive coping skills or problem solving skills  Continued Clinical Symptoms:  Currently patient presents alert, attentive, calm, pleasant on approach, mood described as improved and presents euthymic, with a full range of affect, no thought disorder, not suicidal and presents future oriented, expressing focused on returning to work soon in order not to jeopardize his job.  No homicidal ideations.  No psychotic symptoms. Behavior on unit in good control. Currently not on standing psychiatric medications.  Patient describes recent relapse on cocaine leading to frustration regarding said relapse and to substance-induced mood disorder, now improved/resolved in the context of abstinence.  States he plans to remain sober/abstinent and states he plans to attend NA meetings. I have asked Dr. Jake Samples (psychiatrist/colleague) to assess patient regarding safety for discharge.  Dr. Jake Samples agrees that patient is currently stable and that there are no grounds for involuntary commitment  Cognitive Features That Contribute To Risk:  No gross cognitive deficits noted upon discharge. Is alert , attentive, and oriented x 3   Suicide Risk:  Mild:  Suicidal ideation of limited frequency, intensity, duration, and specificity.  There are no identifiable plans, no associated intent, mild dysphoria and related symptoms, good self-control (both objective and subjective assessment), few other risk factors, and identifiable protective factors, including available and accessible social support.  Follow-up Information    PATIENT DECLINED ANY OUTPATIENT REFERRALS Follow up.  Why: PATIENT DECLINED ANY OUTPATIENT REFERRALS Contact information: PATIENT DECLINED ANY OUTPATIENT REFERRALS          Plan Of Care/Follow-up recommendations:   Activity:  As tolerated Diet:  Regular Tests:  NA Other:  See below  Patient is expressing readiness for discharge, requesting discharge, no current grounds for involuntary commitment.  He is leaving unit in good spirits Plans to return home, reports a past return to work promptly after discharge. Encouraged to attend NA meetings regularly, which he states have helped him remain sober/abstinent in the past.  Ivan CottaFernando A Gabrielly Mccrystal, MD 05/21/2019, 11:38 AM

## 2019-05-21 NOTE — Progress Notes (Signed)
D:  Patient's self inventory sheet, patient sleeps good, no sleep medicine.  Good appetite, normal energy level, good concentration.  Rated depression and hopeless 5, anxiety 6.  Denied withdrawals.  Denied SI.  Denied physical problems.  Denied physical pain.   A:  Medications administered per MD orders.  Emotional support and encouragement given patient. R:  Denied SI and HI, contracts for safety.  Denied A/V hallucinations.  Safety maintained with 15 minute checks.

## 2019-05-21 NOTE — Progress Notes (Signed)
Spiritual care group on grief and loss facilitated by chaplain Jerene Pitch  Group Goal:  Support / Education around grief and loss Members engage in facilitated group support and psycho-social education.  Group Description:  Following introductions and group rules, group members engaged in facilitated group dialog and support around topic of loss, with particular support around experiences of loss in their lives. Group Identified types of loss (relationships / self / things) and identified patterns, circumstances, and changes that precipitate losses. Reflected on thoughts / feelings around loss, normalized grief responses, and recognized variety in grief experience. Patient Progress:  Ivan Murphy was present for beginning of group.  Pulled from group for meeting with treatment team.  He did not return to group.

## 2019-05-21 NOTE — BHH Suicide Risk Assessment (Signed)
[facility] INPATIENT:  Family/Significant Other Suicide Prevention Education  Suicide Prevention Education:  Patient Refusal for Family/Significant Other Suicide Prevention Education: The patient Ivan Murphy has refused to provide written consent for family/significant other to be provided Family/Significant Other Suicide Prevention Education during admission and/or prior to discharge.  Physician notified.  SPE completed with patient, as patient refused to consent to family contact. SPI pamphlet provided to pt and pt was encouraged to share information with support network, ask questions, and talk about any concerns relating to SPE. Patient denies access to guns/firearms and verbalized understanding of information provided. Mobile Crisis information also provided to patient.    Marylee Floras 05/21/2019, 9:50 AM

## 2019-05-21 NOTE — Progress Notes (Signed)
NUTRITION ASSESSMENT RD working remotely.   Pt identified as at risk on the Malnutrition Screen Tool  INTERVENTION: - will order Ensure Enlive once/day, this supplement provides 350 kcal and 20 grams of protein. - will order daily multivitamin with minerals. - continue to encourage PO intakes.    NUTRITION DIAGNOSIS: Unintentional weight loss related to sub-optimal intake as evidenced by pt report.   Goal: Pt to meet >/= 90% of their estimated nutrition needs.  Monitor:  PO intake  Assessment:  Patient admitted voluntarily and has a history of cocaine use, and depression. At the time of admission he reported depression and SI with a thought of overdosing. He rep[orted that two weeks PTA he left [facility] (a sober house, at which he was living) and that after that he relapsed following 2 years of sobriety.   Per chart review, current weight is 189 lb and weight is stable with weights in 2018 and 2016. No other recent weight history available.     58 y.o. male  Height: Ht Readings from Last 1 Encounters:  05/20/19 5\' 7"  (1.702 m)    Weight: Wt Readings from Last 1 Encounters:  05/20/19 85.7 kg    Weight Hx: Wt Readings from Last 10 Encounters:  05/20/19 85.7 kg  05/19/19 93 kg  07/21/17 84 kg  07/17/17 81.6 kg  05/15/15 86.2 kg  01/20/15 86.2 kg  10/04/13 99.8 kg  04/17/13 84.2 kg  02/28/13 81.2 kg  05/08/12 78 kg    BMI:  Body mass index is 29.6 kg/m. Pt meets criteria for overweight based on current BMI.  Estimated Nutritional Needs: Kcal: 25-30 kcal/kg Protein: > 1 gram protein/kg Fluid: 1 ml/kcal  Diet Order:  Diet Order    None     Pt is also offered choice of unit snacks mid-morning and mid-afternoon.  Pt is eating as desired.   Lab results and medications reviewed.     Jarome Matin, MS, RD, LDN, [facility] Inpatient Clinical Dietitian Pager # [phone] After hours/weekend pager # [phone]

## 2019-05-21 NOTE — BHH Counselor (Signed)
Adult Comprehensive Assessment  Patient ID: Ivan Murphy, male   DOB: June 10, 1961, 58 y.o.   MRN: [id number]  Information Source: Information source: Patient  Current Stressors:  Patient states their primary concerns and needs for treatment are:: "Feeling lonely" Patient states their goals for this hospitilization and ongoing recovery are:: "I didnt really accomplish anything while I was here" Educational / Learning stressors: N/A Employment / Job issues: Employed; Denies any current stressors Family Relationships: Reports he has few family supports in [place]; Reports majority of his remaining damily members live in [place]. Financial / Lack of resources (include bankruptcy): Denies any current stressors Housing / Lack of housing: Lives [place] in [place], [place]; Denies any current stressors Physical health (include injuries & life threatening diseases): Denies any current stressors Social relationships: Reports "I feel lonely and I dont have any family around here" Substance abuse: Denies any curret stressors Bereavement / Loss: Denies any curret stressors  Living/Environment/Situation:  Living Arrangements: Alone Living conditions (as described by patient or guardian): "Good" Who else lives in the home?: Alone How long has patient lived in current situation?: 3 months What is atmosphere in current home: Comfortable  Family History:  Marital status: Divorced Divorced, when?: "In the 90's" What types of issues is patient dealing with in the relationship?: Patient did not disclose any further information Additional relationship information: No Are you sexually active?: Yes What is your sexual orientation?: Heterosexual Has your sexual activity been affected by drugs, alcohol, medication, or emotional stress?: No Does patient have children?: Yes How many children?: 1 How is patient's relationship with their children?: Reports having no relationship with his adult daughter  currently.  Childhood History:  By whom was/is the patient raised?: Mother Description of patient's relationship with caregiver when they were a child: "It was really good" Patient's description of current relationship with people who raised him/her: Reports his mother is currently deceased How were you disciplined when you got in trouble as a child/adolescent?: Whoopings and verbally Does patient have siblings?: Yes Number of Siblings: 3 Description of patient's current relationship with siblings: Reports having a good, yet distant relationship with his three older brothers. Did patient suffer any verbal/emotional/physical/sexual abuse as a child?: No Did patient suffer from severe childhood neglect?: No Has patient ever been sexually abused/assaulted/raped as an adolescent or adult?: No Was the patient ever a victim of a crime or a disaster?: No Witnessed domestic violence?: No Has patient been effected by domestic violence as an adult?: No  Education:  Highest grade of school patient has completed: 12th grade Currently a student?: No Learning disability?: No  Employment/Work Situation:   Employment situation: Employed Where is patient currently employed?: [employer] How long has patient been employed?: 2 months Patient's job has been impacted by current illness: No What is the longest time patient has a held a job?: 2 years Where was the patient employed at that time?: [employer] Did You Receive Any Psychiatric Treatment/Services While in the [employer] No Are There Guns or Other Weapons in Your Home?: No  Financial Resources:   Financial resources: Income from employment, Private insurance Does patient have a representative payee or guardian?: No  Alcohol/Substance Abuse:   What has been your use of drugs/alcohol within the last 12 months?: Denies If attempted suicide, did drugs/alcohol play a role in this?: No Alcohol/Substance Abuse Treatment Hx: Denies past  history Has alcohol/substance abuse ever caused legal problems?: No  Social Support System:   Conservation officer, natureatient's Community Support System:  Fair Describe Community Support System: "My friends" Type of faith/religion: Christianity How does patient's faith help to cope with current illness?: Prayer  Leisure/Recreation:   Leisure and Hobbies: "Playing basketball"  Strengths/Needs:   What is the patient's perception of their strengths?: "I enjoy laughing and joking" Patient states they can use these personal strengths during their treatment to contribute to their recovery: Yes Patient states these barriers may affect/interfere with their treatment: No Patient states these barriers may affect their return to the community: No Other important information patient would like considered in planning for their treatment: No  Discharge Plan:   Currently receiving community mental health services: No Patient states concerns and preferences for aftercare planning are: Patient reports he does not want any outpatient medication management or therapy referrals at this time. Patient states they will know when they are safe and ready for discharge when: Yes, as soon as possible Does patient have access to transportation?: No Does patient have financial barriers related to discharge medications?: Yes Patient description of barriers related to discharge medications: Low income Plan for no access to transportation at discharge: [place] ([place]) Will patient be returning to same living situation after discharge?: Yes  Summary/Recommendations:   Summary and Recommendations (to be completed by the evaluator): Ivan Murphy is a 58 year old male who is diagnosed with Cocaine Use Disorder, Substance Induced Mood Disorder and Suicidal Ideations. He presented to the hospital seeking treatment for suicidal ideation. During the assessment, Ivan Murphy was pleasant and cooperative with providing information. Ivan Murphy reports that he came to the  hospital, because he was "lonely". Ivan Murphy denied to this clinican, any substance use prior to coming to the hospital, and reports he does not need any substance abuse treatment at this time. Chritopher reports that he "was just feeling lonely and got down too low". [place] denied any outpatient referrals at this time. Rufus can benefit from crisis stabilization, medication management, therapeutic milieu and referral services.  Marylee Floras. 05/21/2019

## 2019-05-21 NOTE — Progress Notes (Signed)
Discharge Note:  Patient discharged home with lyft.  Patient denied SI and HI.  Denied A/V hallucinations.  Suicide prevention information given and discussed with patient who stated he understood and had no questions.  Patient stated he received all his belongings, clothing, toiletries, misc items, etc.  Patient stated he appreciated all assistance received from [facility] staff.  All required discharge information given to patient at discharge.

## 2019-05-21 NOTE — Progress Notes (Signed)
Recreation Therapy Notes  Date:  8.24.20 Time: 0930 Location: [address] Hall Dayroom  Group Topic: Stress Management  Goal Area(s) Addresses:  Patient will identify positive stress management techniques. Patient will identify benefits of using stress management post d/c.  Intervention: Stress Management  Activity :  Meditation.  LRT played a meditation that focused on making the most of your day.  Patients were to listen and follow along as meditation was played to engage in activity.   Education:  Stress Management, Discharge Planning.   Education Outcome: Acknowledges Education  Clinical Observations/Feedback: Pt did not attend activity.     Lindsea Olivar, LRT/CTRS         Shalaine Payson A 05/21/2019 10:56 AM 

## 2019-05-21 NOTE — Discharge Summary (Addendum)
Physician Discharge Summary Note  Patient:  Ivan Murphy is an 58 y.o., male MRN:  [id number] DOB:  1961/03/16 Patient phone:  [phone] (home)  Patient address:   [address] [place] [id number],  Total Time spent with patient: 15 minutes  Date of Admission:  05/20/2019 Date of Discharge: 05/22/19  Reason for Admission: Cocaine use with suicidal ideation  Principal Problem: <principal problem not specified> Discharge Diagnoses: Active Problems:   MDD (major depressive disorder)   Past Psychiatric History: no prior psychiatric admissions . Denies any history of suicide attempts or of self cutting. Reports history of crack cocaine abuse, states has used several times over the last 2-3 weeks but denies daily use . Denies history of depression other than with regards to substance abuse . Denies history of mania. Denies history of psychosis, denies history of mania or hypomania, denies history of violence   Past Medical History:  Past Medical History:  Diagnosis Date  . Abnormal EKG, felt to be due to hypertension  02/28/2013  . Chest pain at rest 02/26/2013  . Headache(784.0)   . Hypertension   . S/P cardiac cath, 02/28/13 02/28/2013    Past Surgical History:  Procedure Laterality Date  . CARDIAC CATHETERIZATION  02/28/2013  . IRRIGATION AND DEBRIDEMENT ABSCESS    . LEFT HEART CATHETERIZATION WITH CORONARY ANGIOGRAM N/A 02/28/2013   Procedure: LEFT HEART CATHETERIZATION WITH CORONARY ANGIOGRAM;  Surgeon: Runell GessJonathan J Berry, MD;  [facility];  Service: Cardiovascular;  Laterality: N/A;   Family History:  Family History  Problem Relation Age of Onset  . Diabetes Brother    Family Psychiatric  History: denies history of mental illness in family, an aunt was alcoholic, denies history of suicides in family Social History:  Social History   Substance and Sexual Activity  Alcohol Use Yes     Social History   Substance and Sexual Activity  Drug Use Yes  . Types:  Cocaine   Comment: last used 05/19/2019    Social History   Socioeconomic History  . Marital status: Single    Spouse name: Not on file  . Number of children: Not on file  . Years of education: Not on file  . Highest education level: Not on file  Occupational History  . Not on file  Social Needs  . Financial resource strain: Not on file  . Food insecurity    Worry: Not on file    Inability: Not on file  . Transportation needs    Medical: Not on file    Non-medical: Not on file  Tobacco Use  . Smoking status: Current Every Day Smoker    Packs/day: 0.50    Years: 39.00    Pack years: 19.50    Types: Cigarettes  . Smokeless tobacco: Never Used  Substance and Sexual Activity  . Alcohol use: Yes  . Drug use: Yes    Types: Cocaine    Comment: last used 05/19/2019  . Sexual activity: Not Currently  Lifestyle  . Physical activity    Days per week: Not on file    Minutes per session: Not on file  . Stress: Not on file  Relationships  . Social Musicianconnections    Talks on phone: Not on file    Gets together: Not on file    Attends religious service: Not on file    Active member of club or organization: Not on file    Attends meetings of clubs or organizations: Not on file  Relationship status: Not on file  Other Topics Concern  . Not on file  Social History Narrative   Divorced.  One daughter 58 years old.  2 grandchildren.  Father deceased and mother living [place].  [facility].  Former cocaine, marijuana, and tobacco user clean since 02/12/13 when [facility].  Works with the [employer].    Hospital Course:  From admission H&P: 58 year old male, presented to ED on 8/22 reporting cocaine abuse,requesting help for substance abuse, and expressing depression, suicidal ideation with thoughts of overdosing. [facility], a [facility] living setting, and had left about two weeks prior , after which  he had relapsed on cocaine, following two years of sobriety. At this time patient minimizes depression and denies SI, states " I feel I made a mistake, I really was never going to kill myself or hurt myself, I love myself and I don't want to die". States that at the time he came in to ED he was  " upset with myself because I had been out using cocaine and I was frustrated ". Currently he denies depression or neuro-vegetative symptoms of depression and states " it was just a bad day, but I haven't been depressed".  At this time patient is future oriented and hopeful for discharge soon, as he states he needs to return to work soon or his job will be in jeopardy.  Mr. Ladona Ridgelaylor was admitted for reports of suicidal ideation with cocaine use. UDS positive for cocaine. BAL negative. [facility] he denied SI but stated he had come to the ED because he was frustrated that he had relapsed on cocaine. He requested discharge due to being scheduled to start a new job on 05/21/19. [facility]. He declined standing psychotropic medications. He showed no symptoms of withdrawal. He has shown stable mood, affect, sleep, and interaction. He denies any SI/HI/AVH and contracts for safety. On day of discharge, he presents with euthymic affect and reports good mood. He is future-oriented and requesting discharge to return to his new job. He declines referrals for follow-up. He declines psychotropic medications. He is discharging home via Lyft.   Physical Findings: AIMS: Facial and Oral Movements Muscles of Facial Expression: None, normal Lips and Perioral Area: None, normal Jaw: None, normal Tongue: None, normal,Extremity Movements Upper (arms, wrists, hands, fingers): None, normal Lower (legs, knees, ankles, toes): None, normal, Trunk Movements Neck, shoulders, hips: None, normal, Overall Severity Severity of abnormal movements (highest score from questions above): None,  normal Incapacitation due to abnormal movements: None, normal Patient's awareness of abnormal movements (rate only patient's report): No Awareness, Dental Status Current problems with teeth and/or dentures?: No Does patient usually wear dentures?: No  CIWA:  CIWA-Ar Total: 1 COWS:  COWS Total Score: 1  Musculoskeletal: Strength & Muscle Tone: within normal limits Gait & Station: normal Patient leans: N/A  Psychiatric Specialty Exam: Physical Exam  Nursing note and vitals reviewed. Constitutional: He is oriented to person, place, and time. He appears well-developed and well-nourished.  Cardiovascular: Normal rate.  Respiratory: Effort normal.  Neurological: He is alert and oriented to person, place, and time.    Review of Systems  Constitutional: Negative.   Respiratory: Negative for cough and shortness of breath.   Cardiovascular: Negative for chest pain.  Gastrointestinal: Negative for nausea and vomiting.  Neurological: Negative for headaches.  Psychiatric/Behavioral: Positive for substance abuse. Negative for depression, hallucinations and suicidal ideas. The patient is not nervous/anxious and does not have insomnia.     Blood pressure (!) 144/92, pulse 72, temperature 98.2 F (36.8 C), resp. rate 16, height 5\' 7"  (1.702 m), weight 85.7 kg, SpO2 99 %.Body mass index is 29.6 kg/m.  See MD's discharge SRA        Has this patient used any form of tobacco in the last 30 days? (Cigarettes, Smokeless Tobacco, Cigars, and/or Pipes)  No  Blood Alcohol level:  Lab Results  Component Value Date   ETH <10 37/62/8315    Metabolic Disorder Labs:  Lab Results  Component Value Date   HGBA1C 5.5 02/27/2013   MPG 111 02/27/2013   No results found for: PROLACTIN Lab Results  Component Value Date   CHOL 152 04/18/2013   TRIG 67 04/18/2013   HDL 47 04/18/2013   CHOLHDL 3.2 04/18/2013   VLDL 13 04/18/2013   LDLCALC 92 04/18/2013   LDLCALC 124 (H) 02/27/2013    See  Psychiatric Specialty Exam and Suicide Risk Assessment completed by Attending Physician prior to discharge.  Discharge destination:  Home  Is patient on multiple antipsychotic therapies at discharge:  No   Has Patient had three or more failed trials of antipsychotic monotherapy by history:  No  Recommended Plan for Multiple Antipsychotic Therapies: NA  Discharge Instructions    Discharge instructions   Complete by: As directed    Patient is instructed to take all prescribed medications as recommended. Report any side effects or adverse reactions to your outpatient psychiatrist. Patient is instructed to abstain from alcohol and illegal drugs while on prescription medications. In the event of worsening symptoms, patient is instructed to call the crisis hotline, 911, or go to the nearest emergency department for evaluation and treatment.     Allergies as of 05/21/2019   No Known Allergies     Medication List    STOP taking these medications   naproxen 500 MG tablet Commonly known as: Naprosyn   ondansetron 4 MG tablet Commonly known as: ZOFRAN      Follow-up Information    PATIENT DECLINED ANY OUTPATIENT REFERRALS Follow up.   Why: PATIENT DECLINED ANY OUTPATIENT REFERRALS Contact information: PATIENT DECLINED ANY OUTPATIENT REFERRALS          Follow-up recommendations: Activity as tolerated. Diet as recommended by primary care physician. Keep all scheduled follow-up appointments as recommended.   Comments:   Patient is instructed to take all prescribed medications as recommended. Report any side effects or adverse reactions to your outpatient psychiatrist. Patient is instructed to abstain from alcohol and illegal drugs while on prescription medications. In the event of worsening symptoms, patient is instructed to call the crisis hotline, 911, or go to the nearest emergency department for evaluation and treatment.  Signed: Connye Burkitt, NP 05/21/2019, 11:14 AM    Patient seen, Suicide Assessment Completed.  Disposition Plan Reviewed

## 2019-05-21 NOTE — Progress Notes (Signed)
  [facility] Adult Case Management Discharge Plan :  Will you be returning to the same living situation after discharge:  Yes,  patient reports he is returning home, alone.  At discharge, do you have transportation home?: Yes,  [employer] ([employer]) transport Do you have the ability to pay for your medications: Yes,  BCBS, income from employment  Release of information consent forms completed and in the chart;  Patient's signature needed at discharge.  Patient to Follow up at: Follow-up Information    PATIENT DECLINED ANY OUTPATIENT REFERRALS Follow up.   Why: PATIENT DECLINED ANY OUTPATIENT REFERRALS Contact information: PATIENT DECLINED ANY OUTPATIENT REFERRALS          Next level of care provider has access to [place] and Suicide Prevention discussed: Yes,  with the patient     Has patient been referred to the Quitline?: Patient refused referral  Patient has been referred for addiction treatment: Pt. refused referral  Marylee Floras, [place] 05/21/2019, 11:15 AM

## 2019-05-21 NOTE — Tx Team (Signed)
Interdisciplinary Treatment and Diagnostic Plan Update  05/21/2019 Time of Session: 10:10am Ivan Murphy MRN: [id number]  Principal Diagnosis: <principal problem not specified>  Secondary Diagnoses: Active Problems:   MDD (major depressive disorder)   Cocaine dependence with cocaine-induced mood disorder (HCC)   Current Medications:  Current Facility-Administered Medications  Medication Dose Route Frequency Provider Last Rate Last Dose  . acetaminophen (TYLENOL) tablet 650 mg  650 mg Oral Q6H PRN Oneta RackLewis, Tanika N, NP      . alum & mag hydroxide-simeth (MAALOX/MYLANTA) 200-200-20 MG/5ML suspension 30 mL  30 mL Oral Q4H PRN Oneta RackLewis, Tanika N, NP      . feeding supplement (ENSURE ENLIVE) (ENSURE ENLIVE) liquid 237 mL  237 mL Oral Q24H Cobos, Rockey SituFernando A, MD      . hydrOXYzine (ATARAX/VISTARIL) tablet 25 mg  25 mg Oral TID PRN Oneta RackLewis, Tanika N, NP      . magnesium hydroxide (MILK OF MAGNESIA) suspension 30 mL  30 mL Oral Daily PRN Oneta RackLewis, Tanika N, NP      . multivitamin with minerals tablet 1 tablet  1 tablet Oral Daily Cobos, Rockey SituFernando A, MD   1 tablet at 05/21/19 1015   PTA Medications: Medications Prior to Admission  Medication Sig Dispense Refill Last Dose  . naproxen (NAPROSYN) 500 MG tablet Take 1 tablet (500 mg total) by mouth 2 (two) times daily with a meal. (Patient not taking: Reported on 05/20/2019) 30 tablet 0   . ondansetron (ZOFRAN) 4 MG tablet Take 2 tablets (8 mg total) by mouth every 8 (eight) hours as needed for nausea or vomiting. (Patient not taking: Reported on 05/20/2019) 12 tablet 0     Patient Stressors: Financial difficulties Substance abuse  Patient Strengths: Capable of independent living Manufacturing systems engineerCommunication skills Supportive family/friends  Treatment Modalities: Medication Management, Group therapy, Case management,  1 to 1 session with clinician, Psychoeducation, Recreational therapy.   Physician Treatment Plan for Primary Diagnosis: <principal problem not  specified> Long Term Goal(s): Improvement in symptoms so as ready for discharge Improvement in symptoms so as ready for discharge   Short Term Goals: Ability to identify changes in lifestyle to reduce recurrence of condition will improve Ability to identify triggers associated with substance abuse/mental health issues will improve Ability to identify changes in lifestyle to reduce recurrence of condition will improve Ability to verbalize feelings will improve Ability to disclose and discuss suicidal ideas Ability to demonstrate self-control will improve Ability to identify and develop effective coping behaviors will improve Ability to maintain clinical measurements within normal limits will improve  Medication Management: Evaluate patient's response, side effects, and tolerance of medication regimen.  Therapeutic Interventions: 1 to 1 sessions, Unit Group sessions and Medication administration.  Evaluation of Outcomes: Adequate for Discharge  Physician Treatment Plan for Secondary Diagnosis: Active Problems:   MDD (major depressive disorder)   Cocaine dependence with cocaine-induced mood disorder (HCC)  Long Term Goal(s): Improvement in symptoms so as ready for discharge Improvement in symptoms so as ready for discharge   Short Term Goals: Ability to identify changes in lifestyle to reduce recurrence of condition will improve Ability to identify triggers associated with substance abuse/mental health issues will improve Ability to identify changes in lifestyle to reduce recurrence of condition will improve Ability to verbalize feelings will improve Ability to disclose and discuss suicidal ideas Ability to demonstrate self-control will improve Ability to identify and develop effective coping behaviors will improve Ability to maintain clinical measurements within normal limits will improve  Medication Management: Evaluate patient's response, side effects, and tolerance of  medication regimen.  Therapeutic Interventions: 1 to 1 sessions, Unit Group sessions and Medication administration.  Evaluation of Outcomes: Adequate for Discharge   RN Treatment Plan for Primary Diagnosis: <principal problem not specified> Long Term Goal(s): Knowledge of disease and therapeutic regimen to maintain health will improve  Short Term Goals: Ability to participate in decision making will improve, Ability to verbalize feelings will improve, Ability to disclose and discuss suicidal ideas, Ability to identify and develop effective coping behaviors will improve and Compliance with prescribed medications will improve  Medication Management: RN will administer medications as ordered by provider, will assess and evaluate patient's response and provide education to patient for prescribed medication. RN will report any adverse and/or side effects to prescribing provider.  Therapeutic Interventions: 1 on 1 counseling sessions, Psychoeducation, Medication administration, Evaluate responses to treatment, Monitor vital signs and CBGs as ordered, Perform/monitor CIWA, COWS, AIMS and Fall Risk screenings as ordered, Perform wound care treatments as ordered.  Evaluation of Outcomes: Adequate for Discharge   LCSW Treatment Plan for Primary Diagnosis: <principal problem not specified> Long Term Goal(s): Safe transition to appropriate next level of care at discharge, Engage patient in therapeutic group addressing interpersonal concerns.  Short Term Goals: Engage patient in aftercare planning with referrals and resources and Increase skills for wellness and recovery  Therapeutic Interventions: Assess for all discharge needs, 1 to 1 time with Social worker, Explore available resources and support systems, Assess for adequacy in community support network, Educate family and significant other(s) on suicide prevention, Complete Psychosocial Assessment, Interpersonal group therapy.  Evaluation of  Outcomes: Adequate for Discharge   Progress in Treatment: Attending groups: Yes. Participating in groups: Yes. Taking medication as prescribed: As evidenced by:  Not yet Toleration medication: As evidenced by:  Not yet Family/Significant other contact made: Yes, individual(s) contacted:  with pt Patient understands diagnosis: Yes. Discussing patient identified problems/goals with staff: Yes. Medical problems stabilized or resolved: Yes. Denies suicidal/homicidal ideation: Yes. Issues/concerns per patient self-inventory: No. Other:   New problem(s) identified: No, Describe:  None  New Short Term/Long Term Goal(s): Medication stabilization, elimination of SI thoughts, and development of a comprehensive mental wellness plan.   Patient Goals:  "Go back to work and do what I need to do"  Discharge Plan or Barriers: CSW will continue to follow up for appropriate referrals and possible discharge planning  Reason for Continuation of Hospitalization: Patient is discharging today.   Estimated Length of Stay: Patient is discharging today.   Attendees: Patient: Ivan Murphy  05/21/2019  Physician:  05/21/2019  Nurse Practitioner: Marcie Bal, NP 05/21/2019   Nursing: Legrand Como, RN 05/21/2019   RN Care Manager: 05/21/2019  Social Worker: Ardelle Anton, [place] 05/21/2019   Recreational Therapist:  05/21/2019  Other:  05/21/2019   Other:  05/21/2019   Other: 05/21/2019      Scribe for Treatment Team: Trecia Rogers, LCSW 05/21/2019 12:34 PM

## 2020-01-02 ENCOUNTER — Encounter: Payer: Self-pay | Admitting: Emergency Medicine

## 2020-01-02 ENCOUNTER — Ambulatory Visit
Admission: EM | Admit: 2020-01-02 | Discharge: 2020-01-02 | Disposition: A | Discharge status: Home / Self Care | Payer: BC Managed Care – PPO | Attending: Emergency Medicine | Admitting: Emergency Medicine

## 2020-01-02 ENCOUNTER — Other Ambulatory Visit: Payer: Self-pay

## 2020-01-02 DIAGNOSIS — R1084 Generalized abdominal pain: Secondary | ICD-10-CM | POA: Insufficient documentation

## 2020-01-02 LAB — POCT URINALYSIS DIP (MANUAL ENTRY)
Bilirubin, UA: NEGATIVE
Blood, UA: NEGATIVE
Glucose, UA: NEGATIVE mg/dL
Ketones, POC UA: NEGATIVE mg/dL
Nitrite, UA: NEGATIVE
Protein Ur, POC: 30 mg/dL — AB
Spec Grav, UA: 1.015 (ref 1.010–1.025)
Urobilinogen, UA: 0.2 E.U./dL
pH, UA: 8.5 — AB (ref 5.0–8.0)

## 2020-01-02 NOTE — ED Notes (Signed)
Patient is easy to wake, but patient falls asleep frequently.  Patient fell asleep intermittently during nursing assessment.

## 2020-01-02 NOTE — ED Triage Notes (Signed)
Patient has back and stomach pain.  This started 2 days ago.  Coughing causes back and stomach pain.  Non-productive cough, unknown if running fever.  No vomiting or diarrhea.

## 2020-01-02 NOTE — Discharge Instructions (Addendum)
Recommending further evaluation and management in the ED cannot determine cause of abdominal pain.  Patient aware and in agreement with plan.  Will go by private vehicle to [facility] ED.  Declines EMS transport.
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
## 2020-01-03 LAB — URINE CULTURE: Culture: NO GROWTH

## 2020-02-14 ENCOUNTER — Emergency Department (HOSPITAL_COMMUNITY)
Admission: EM | Admit: 2020-02-14 | Discharge: 2020-02-14 | Disposition: A | Discharge status: Home / Self Care | Payer: No Typology Code available for payment source | Attending: Emergency Medicine | Admitting: Emergency Medicine

## 2020-02-14 ENCOUNTER — Emergency Department (HOSPITAL_COMMUNITY): Payer: No Typology Code available for payment source

## 2020-02-14 ENCOUNTER — Encounter (HOSPITAL_COMMUNITY): Payer: Self-pay | Admitting: *Deleted

## 2020-02-14 ENCOUNTER — Other Ambulatory Visit: Payer: Self-pay

## 2020-02-14 DIAGNOSIS — F17210 Nicotine dependence, cigarettes, uncomplicated: Secondary | ICD-10-CM | POA: Diagnosis not present

## 2020-02-14 DIAGNOSIS — I10 Essential (primary) hypertension: Secondary | ICD-10-CM | POA: Diagnosis not present

## 2020-02-14 DIAGNOSIS — R519 Headache, unspecified: Secondary | ICD-10-CM | POA: Diagnosis not present

## 2020-02-14 DIAGNOSIS — M549 Dorsalgia, unspecified: Secondary | ICD-10-CM

## 2020-02-14 DIAGNOSIS — Y92410 Unspecified street and highway as the place of occurrence of the external cause: Secondary | ICD-10-CM | POA: Insufficient documentation

## 2020-02-14 DIAGNOSIS — M542 Cervicalgia: Secondary | ICD-10-CM | POA: Insufficient documentation

## 2020-02-14 DIAGNOSIS — Y939 Activity, unspecified: Secondary | ICD-10-CM | POA: Insufficient documentation

## 2020-02-14 DIAGNOSIS — Y999 Unspecified external cause status: Secondary | ICD-10-CM | POA: Diagnosis not present

## 2020-02-14 DIAGNOSIS — M546 Pain in thoracic spine: Secondary | ICD-10-CM | POA: Insufficient documentation

## 2020-02-14 MED ORDER — ACETAMINOPHEN 500 MG PO TABS
1000.0000 mg | ORAL_TABLET | Freq: Once | ORAL | Status: DC
  Filled 2020-02-14: qty 2

## 2020-02-14 MED ORDER — HYDROCODONE-ACETAMINOPHEN 5-325 MG PO TABS
1.0000 | ORAL_TABLET | Freq: Once | ORAL | Status: AC
  Administered 2020-02-14: 1 via ORAL
  Filled 2020-02-14: qty 1

## 2020-02-14 MED ORDER — METHOCARBAMOL 500 MG PO TABS: 500.0000 mg | ORAL_TABLET | Freq: Two times a day (BID) | ORAL | 0 refills | Status: DC
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
## 2020-02-14 NOTE — Discharge Instructions (Signed)

## 2020-05-09 ENCOUNTER — Encounter (HOSPITAL_COMMUNITY): Payer: Self-pay | Admitting: *Deleted

## 2020-05-09 ENCOUNTER — Other Ambulatory Visit: Payer: Self-pay

## 2020-05-09 ENCOUNTER — Emergency Department (HOSPITAL_COMMUNITY)
Admission: EM | Admit: 2020-05-09 | Discharge: 2020-05-09 | Disposition: A | Discharge status: Home / Self Care | Payer: Self-pay | Attending: Emergency Medicine | Admitting: Emergency Medicine

## 2020-05-09 DIAGNOSIS — M5442 Lumbago with sciatica, left side: Secondary | ICD-10-CM | POA: Insufficient documentation

## 2020-05-09 DIAGNOSIS — I10 Essential (primary) hypertension: Secondary | ICD-10-CM | POA: Insufficient documentation

## 2020-05-09 DIAGNOSIS — M5441 Lumbago with sciatica, right side: Secondary | ICD-10-CM | POA: Insufficient documentation

## 2020-05-09 DIAGNOSIS — F17210 Nicotine dependence, cigarettes, uncomplicated: Secondary | ICD-10-CM | POA: Insufficient documentation

## 2020-05-09 DIAGNOSIS — G8929 Other chronic pain: Secondary | ICD-10-CM

## 2020-05-09 MED ORDER — HYDROCODONE-ACETAMINOPHEN 5-325 MG PO TABS: ORAL_TABLET | ORAL | 0 refills | Status: DC

## 2020-05-09 MED ORDER — PREDNISONE 10 MG PO TABS: ORAL_TABLET | ORAL | 0 refills | Status: DC

## 2020-05-09 MED ORDER — CYCLOBENZAPRINE HCL 10 MG PO TABS: 10.0000 mg | ORAL_TABLET | Freq: Three times a day (TID) | ORAL | 0 refills | Status: AC | PRN

## 2020-05-09 NOTE — Discharge Instructions (Addendum)
Try alternating ice and heat to your lower back.  Avoid twisting, bending or heavy lifting for at least 7 days.  Follow-up with your primary care provider or with the orthopedic provider listed
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
## 2020-05-14 ENCOUNTER — Emergency Department (HOSPITAL_COMMUNITY)
Admission: EM | Admit: 2020-05-14 | Discharge: 2020-05-14 | Disposition: A | Discharge status: Home / Self Care | Payer: Self-pay | Attending: Emergency Medicine | Admitting: Emergency Medicine

## 2020-05-14 ENCOUNTER — Encounter (HOSPITAL_COMMUNITY): Payer: Self-pay | Admitting: *Deleted

## 2020-05-14 ENCOUNTER — Emergency Department (HOSPITAL_COMMUNITY): Payer: Self-pay

## 2020-05-14 ENCOUNTER — Other Ambulatory Visit: Payer: Self-pay

## 2020-05-14 DIAGNOSIS — F17210 Nicotine dependence, cigarettes, uncomplicated: Secondary | ICD-10-CM | POA: Insufficient documentation

## 2020-05-14 DIAGNOSIS — Y9389 Activity, other specified: Secondary | ICD-10-CM | POA: Insufficient documentation

## 2020-05-14 DIAGNOSIS — Z20822 Contact with and (suspected) exposure to covid-19: Secondary | ICD-10-CM | POA: Insufficient documentation

## 2020-05-14 DIAGNOSIS — I10 Essential (primary) hypertension: Secondary | ICD-10-CM | POA: Insufficient documentation

## 2020-05-14 DIAGNOSIS — S81812A Laceration without foreign body, left lower leg, initial encounter: Secondary | ICD-10-CM | POA: Insufficient documentation

## 2020-05-14 DIAGNOSIS — T148XXA Other injury of unspecified body region, initial encounter: Secondary | ICD-10-CM

## 2020-05-14 DIAGNOSIS — W268XXA Contact with other sharp object(s), not elsewhere classified, initial encounter: Secondary | ICD-10-CM | POA: Insufficient documentation

## 2020-05-14 DIAGNOSIS — Y92000 Kitchen of unspecified non-institutional (private) residence as  the place of occurrence of the external cause: Secondary | ICD-10-CM | POA: Insufficient documentation

## 2020-05-14 DIAGNOSIS — Y999 Unspecified external cause status: Secondary | ICD-10-CM | POA: Insufficient documentation

## 2020-05-14 DIAGNOSIS — Z23 Encounter for immunization: Secondary | ICD-10-CM | POA: Insufficient documentation

## 2020-05-14 LAB — CBC WITH DIFFERENTIAL/PLATELET
Abs Immature Granulocytes: 0.01 10*3/uL (ref 0.00–0.07)
Basophils Absolute: 0 10*3/uL (ref 0.0–0.1)
Basophils Relative: 1 %
Eosinophils Absolute: 0.2 10*3/uL (ref 0.0–0.5)
Eosinophils Relative: 4 %
HCT: 42 % (ref 39.0–52.0)
Hemoglobin: 13.9 g/dL (ref 13.0–17.0)
Immature Granulocytes: 0 %
Lymphocytes Relative: 25 %
Lymphs Abs: 1.3 10*3/uL (ref 0.7–4.0)
MCH: 27.9 pg (ref 26.0–34.0)
MCHC: 33.1 g/dL (ref 30.0–36.0)
MCV: 84.2 fL (ref 80.0–100.0)
Monocytes Absolute: 0.6 10*3/uL (ref 0.1–1.0)
Monocytes Relative: 11 %
Neutro Abs: 3.2 10*3/uL (ref 1.7–7.7)
Neutrophils Relative %: 59 %
Platelets: 203 10*3/uL (ref 150–400)
RBC: 4.99 MIL/uL (ref 4.22–5.81)
RDW: 15.1 % (ref 11.5–15.5)
WBC: 5.4 10*3/uL (ref 4.0–10.5)
nRBC: 0 % (ref 0.0–0.2)

## 2020-05-14 LAB — COMPREHENSIVE METABOLIC PANEL
ALT: 15 U/L (ref 0–44)
AST: 18 U/L (ref 15–41)
Albumin: 3.6 g/dL (ref 3.5–5.0)
Alkaline Phosphatase: 54 U/L (ref 38–126)
Anion gap: 8 (ref 5–15)
BUN: 14 mg/dL (ref 6–20)
CO2: 24 mmol/L (ref 22–32)
Calcium: 8.5 mg/dL — ABNORMAL LOW (ref 8.9–10.3)
Chloride: 106 mmol/L (ref 98–111)
Creatinine, Ser: 0.96 mg/dL (ref 0.61–1.24)
GFR calc Af Amer: >60 mL/min (ref >60)
GFR calc non Af Amer: >60 mL/min (ref >60)
Glucose, Bld: 112 mg/dL — ABNORMAL HIGH (ref 70–99)
Potassium: 3.6 mmol/L (ref 3.5–5.1)
Sodium: 138 mmol/L (ref 135–145)
Total Bilirubin: 0.4 mg/dL (ref 0.3–1.2)
Total Protein: 7.2 g/dL (ref 6.5–8.1)

## 2020-05-14 LAB — SAMPLE TO BLOOD BANK

## 2020-05-14 LAB — SARS CORONAVIRUS 2 BY RT PCR (HOSPITAL ORDER, PERFORMED IN ~~LOC~~ HOSPITAL LAB): SARS Coronavirus 2: NEGATIVE

## 2020-05-14 MED ORDER — OXYCODONE-ACETAMINOPHEN 5-325 MG PO TABS
1.0000 | ORAL_TABLET | Freq: Four times a day (QID) | ORAL | 0 refills | Status: AC | PRN
Start: 2020-05-14 — End: ?

## 2020-05-14 MED ORDER — AMOXICILLIN-POT CLAVULANATE 875-125 MG PO TABS: 1.0000 | ORAL_TABLET | Freq: Two times a day (BID) | ORAL | 0 refills | Status: DC

## 2020-05-14 MED ORDER — SODIUM CHLORIDE 0.9 % IV SOLN: INTRAVENOUS | Status: AC

## 2020-05-14 MED ORDER — LIDOCAINE-EPINEPHRINE (PF) 2 %-1:200000 IJ SOLN
20.0000 mL | Freq: Once | INTRAMUSCULAR | Status: DC
  Filled 2020-05-14: qty 20

## 2020-05-14 MED ORDER — OXYCODONE-ACETAMINOPHEN 5-325 MG PO TABS
1.0000 | ORAL_TABLET | Freq: Once | ORAL | Status: AC
  Administered 2020-05-14: 1 via ORAL
  Filled 2020-05-14: qty 1

## 2020-05-14 MED ORDER — FENTANYL CITRATE (PF) 100 MCG/2ML IJ SOLN
50.0000 ug | INTRAMUSCULAR | Status: DC | PRN
  Administered 2020-05-14: 50 ug via INTRAVENOUS
  Filled 2020-05-14: qty 2

## 2020-05-14 MED ORDER — CEFAZOLIN SODIUM-DEXTROSE 2-4 GM/100ML-% IV SOLN
2.0000 g | Freq: Once | INTRAVENOUS | Status: AC
  Administered 2020-05-14: 2 g via INTRAVENOUS
  Filled 2020-05-14: qty 100

## 2020-05-14 MED ORDER — TETANUS-DIPHTH-ACELL PERTUSSIS 5-2.5-18.5 LF-MCG/0.5 IM SUSP
0.5000 mL | Freq: Once | INTRAMUSCULAR | Status: AC
  Administered 2020-05-14: 0.5 mL via INTRAMUSCULAR
  Filled 2020-05-14: qty 0.5

## 2020-05-14 MED ORDER — MORPHINE SULFATE (PF) 4 MG/ML IV SOLN: 4.0000 mg | INTRAVENOUS | Status: DC | PRN

## 2020-05-14 MED ORDER — BUPIVACAINE HCL (PF) 0.5 % IJ SOLN
10.0000 mL | Freq: Once | INTRAMUSCULAR | Status: DC
  Filled 2020-05-14: qty 30
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
## 2020-05-14 NOTE — ED Notes (Signed)
Xeroform, telfa, abd pads and ace wrap applied to left leg

## 2020-05-14 NOTE — ED Notes (Signed)
Pt undressed and given warm blanket and updated on current situation of transfer; pt requesting phone to call daughter

## 2020-05-14 NOTE — Discharge Instructions (Signed)
Please do not put any weight on your left leg. You need to either come back to the emergency room in 2 days for wound check or be seen by orthopedics if they can see you in the next 2 days. It is very important that you take the antibiotics. Your wound has a high risk of infection given how deep it is. As we discussed you may need additional procedures to further repair your wound.  Do not take the Percocet (oxycodone) if you are taking any Vicodin (hydrocodone).  Please take Ibuprofen (Advil, motrin) and Tylenol (acetaminophen) to relieve your pain.  You may take up to 600 MG (3 pills) of normal strength ibuprofen every 8 hours as needed.  In between doses of ibuprofen you make take tylenol, up to 1,000 mg (two extra strength pills).  Do not take more than 3,000 mg tylenol in a 24 hour period.  Please check all medication labels as many medications such as pain and cold medications may contain tylenol.  Do not drink alcohol while taking these medications.  Do not take other NSAID'S while taking ibuprofen (such as aleve or naproxen).  Please take ibuprofen with food to decrease stomach upset.  You may have diarrhea from the antibiotics.  It is very important that you continue to take the antibiotics even if you get diarrhea unless a medical professional tells you that you may stop taking them.  If you stop too early the bacteria you are being treated for will become stronger and you may need different, more powerful antibiotics that have more side effects and worsening diarrhea.  Please stay well hydrated and consider probiotics as they may decrease the severity of your diarrhea.   Today you received medications that may make you sleepy or impair your ability to make decisions.  For the next 24 hours please do not drive, operate heavy machinery, care for a small child with out another adult present, or perform any activities that may cause harm to you or someone else if you were to fall asleep or be  impaired.   You are being prescribed a medication which may make you sleepy. Please follow up of listed precautions for at least 24 hours after taking one dose.
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
## 2020-05-15 ENCOUNTER — Other Ambulatory Visit: Payer: Self-pay

## 2020-05-15 ENCOUNTER — Encounter (HOSPITAL_COMMUNITY): Payer: Self-pay | Admitting: Emergency Medicine

## 2020-05-15 ENCOUNTER — Emergency Department (HOSPITAL_COMMUNITY)
Admission: EM | Admit: 2020-05-15 | Discharge: 2020-05-15 | Disposition: A | Discharge status: Home / Self Care | Payer: Self-pay | Attending: Emergency Medicine | Admitting: Emergency Medicine

## 2020-05-15 DIAGNOSIS — I10 Essential (primary) hypertension: Secondary | ICD-10-CM | POA: Insufficient documentation

## 2020-05-15 DIAGNOSIS — F17210 Nicotine dependence, cigarettes, uncomplicated: Secondary | ICD-10-CM | POA: Insufficient documentation

## 2020-05-15 DIAGNOSIS — G8929 Other chronic pain: Secondary | ICD-10-CM

## 2020-05-15 DIAGNOSIS — M545 Low back pain: Secondary | ICD-10-CM | POA: Insufficient documentation

## 2020-05-15 MED ORDER — IBUPROFEN 400 MG PO TABS
600.0000 mg | ORAL_TABLET | Freq: Once | ORAL | Status: AC
  Administered 2020-05-15: 600 mg via ORAL
  Filled 2020-05-15: qty 2

## 2020-05-15 MED ORDER — IBUPROFEN 600 MG PO TABS: 600.0000 mg | ORAL_TABLET | Freq: Four times a day (QID) | ORAL | 0 refills | Status: AC | PRN

## 2020-05-15 MED ORDER — LIDOCAINE 5 % EX PTCH
1.0000 | MEDICATED_PATCH | Freq: Once | CUTANEOUS | Status: DC
  Administered 2020-05-15: 1 via TRANSDERMAL
  Filled 2020-05-15: qty 1

## 2020-05-15 MED FILL — Oxycodone w/ Acetaminophen Tab 5-325 MG: ORAL | Qty: 6 | Status: AC

## 2020-05-15 NOTE — ED Triage Notes (Signed)
Pt c/o of lower back pain x3 months.
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
## 2020-05-15 NOTE — Discharge Instructions (Signed)
You can buy over the counter patches to help with your back pain. These are called Salonpas and can be found at any drug store including [facility].  Prescription sent to your pharmacy for ibuprofen. This is an anti-inflammatory medicine.  Take with food so it does not cause an upset stomach.  This can help with your back pain.

## 2020-05-16 ENCOUNTER — Emergency Department (HOSPITAL_COMMUNITY): Admission: EM | Admit: 2020-05-16 | Discharge: 2020-05-16 | Discharge status: Against Medical Advice | Payer: Self-pay

## 2020-05-20 ENCOUNTER — Emergency Department (HOSPITAL_COMMUNITY)
Admission: EM | Admit: 2020-05-20 | Discharge: 2020-05-20 | Disposition: A | Discharge status: Home / Self Care | Payer: Self-pay | Attending: Emergency Medicine | Admitting: Emergency Medicine

## 2020-05-20 ENCOUNTER — Encounter (HOSPITAL_COMMUNITY): Payer: Self-pay | Admitting: Emergency Medicine

## 2020-05-20 ENCOUNTER — Other Ambulatory Visit: Payer: Self-pay

## 2020-05-20 DIAGNOSIS — Z4802 Encounter for removal of sutures: Secondary | ICD-10-CM | POA: Insufficient documentation

## 2020-05-20 DIAGNOSIS — Z5321 Procedure and treatment not carried out due to patient leaving prior to being seen by health care provider: Secondary | ICD-10-CM | POA: Insufficient documentation

## 2020-05-20 DIAGNOSIS — M79605 Pain in left leg: Secondary | ICD-10-CM | POA: Insufficient documentation

## 2020-05-20 NOTE — ED Triage Notes (Signed)
Pt complains of left leg pain at a repaired laceration site. Pt is here for evaluation of staple removal as his leg was bleeding earlier today- no bleeding noted now.

## 2020-05-20 NOTE — ED Triage Notes (Signed)
Pt is here for suture removal

## 2020-05-21 ENCOUNTER — Emergency Department (HOSPITAL_COMMUNITY)
Admission: EM | Admit: 2020-05-21 | Discharge: 2020-05-21 | Disposition: A | Discharge status: Home / Self Care | Payer: Self-pay | Attending: Emergency Medicine | Admitting: Emergency Medicine

## 2020-05-24 ENCOUNTER — Encounter (HOSPITAL_COMMUNITY): Payer: Self-pay

## 2020-05-24 ENCOUNTER — Emergency Department (HOSPITAL_COMMUNITY)
Admission: EM | Admit: 2020-05-24 | Discharge: 2020-05-24 | Disposition: A | Discharge status: Home / Self Care | Payer: Self-pay | Attending: Emergency Medicine | Admitting: Emergency Medicine

## 2020-05-24 ENCOUNTER — Other Ambulatory Visit: Payer: Self-pay

## 2020-05-24 DIAGNOSIS — W275XXD Contact with paper-cutter, subsequent encounter: Secondary | ICD-10-CM | POA: Insufficient documentation

## 2020-05-24 DIAGNOSIS — T148XXA Other injury of unspecified body region, initial encounter: Secondary | ICD-10-CM

## 2020-05-24 DIAGNOSIS — Y999 Unspecified external cause status: Secondary | ICD-10-CM | POA: Insufficient documentation

## 2020-05-24 DIAGNOSIS — I10 Essential (primary) hypertension: Secondary | ICD-10-CM | POA: Insufficient documentation

## 2020-05-24 DIAGNOSIS — Y939 Activity, unspecified: Secondary | ICD-10-CM | POA: Insufficient documentation

## 2020-05-24 DIAGNOSIS — Z79899 Other long term (current) drug therapy: Secondary | ICD-10-CM | POA: Insufficient documentation

## 2020-05-24 DIAGNOSIS — F17210 Nicotine dependence, cigarettes, uncomplicated: Secondary | ICD-10-CM | POA: Insufficient documentation

## 2020-05-24 DIAGNOSIS — Y929 Unspecified place or not applicable: Secondary | ICD-10-CM | POA: Insufficient documentation

## 2020-05-24 DIAGNOSIS — S81812D Laceration without foreign body, left lower leg, subsequent encounter: Secondary | ICD-10-CM | POA: Insufficient documentation

## 2020-05-24 DIAGNOSIS — Z4802 Encounter for removal of sutures: Secondary | ICD-10-CM

## 2020-05-24 LAB — CBC WITH DIFFERENTIAL/PLATELET
Abs Immature Granulocytes: 0.07 10*3/uL (ref 0.00–0.07)
Basophils Absolute: 0 10*3/uL (ref 0.0–0.1)
Basophils Relative: 0 %
Eosinophils Absolute: 0.4 10*3/uL (ref 0.0–0.5)
Eosinophils Relative: 3 %
HCT: 38.5 % — ABNORMAL LOW (ref 39.0–52.0)
Hemoglobin: 12.8 g/dL — ABNORMAL LOW (ref 13.0–17.0)
Lymphocytes Relative: 12 %
Lymphs Abs: 1.7 10*3/uL (ref 0.7–4.0)
MCH: 28.1 pg (ref 26.0–34.0)
MCHC: 33.2 g/dL (ref 30.0–36.0)
MCV: 84.4 fL (ref 80.0–100.0)
Monocytes Absolute: 1.5 10*3/uL — ABNORMAL HIGH (ref 0.1–1.0)
Monocytes Relative: 11 %
Neutro Abs: 10.2 10*3/uL — ABNORMAL HIGH (ref 1.7–7.7)
Neutrophils Relative %: 74 %
Platelets: 208 10*3/uL (ref 150–400)
RBC: 4.56 MIL/uL (ref 4.22–5.81)
RDW: 15 % (ref 11.5–15.5)
WBC: 13.8 10*3/uL — ABNORMAL HIGH (ref 4.0–10.5)
nRBC: 0 % (ref 0.0–0.2)

## 2020-05-24 MED ORDER — SULFAMETHOXAZOLE-TRIMETHOPRIM 800-160 MG PO TABS: 1.0000 | ORAL_TABLET | Freq: Two times a day (BID) | ORAL | 0 refills | Status: AC

## 2020-05-24 MED ORDER — AMOXICILLIN-POT CLAVULANATE 875-125 MG PO TABS: 1.0000 | ORAL_TABLET | Freq: Two times a day (BID) | ORAL | 0 refills | Status: DC

## 2020-05-24 MED ORDER — SODIUM CHLORIDE 0.9 % IV SOLN
3.0000 g | Freq: Once | INTRAVENOUS | Status: AC
  Administered 2020-05-24: 3 g via INTRAVENOUS
  Filled 2020-05-24: qty 8

## 2020-05-24 NOTE — Discharge Instructions (Signed)
Follow-up in the ER in 1-2 days for wound check.  Return sooner if wound begins to look worse, increased pain or developing fevers or feeling bad all over.  Take the Augmentin prescription after you finish the other Augmentin tablets to complete a course of 7 days from now.  Also start the Bactrim.

## 2020-05-24 NOTE — ED Triage Notes (Signed)
Pt is here for suture removal to left leg.
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
## 2021-07-30 IMAGING — DX DG FEMUR 2+V*L*
4 series · 4 of 4 positions shown · non-contrast
Comparison: None.

CLINICAL DATA: 58-year-old male with left lateral thigh laceration.
Query foreign body or bone injury.

EXAM:
LEFT FEMUR 2 VIEWS

[femur ap (1 of 2)]
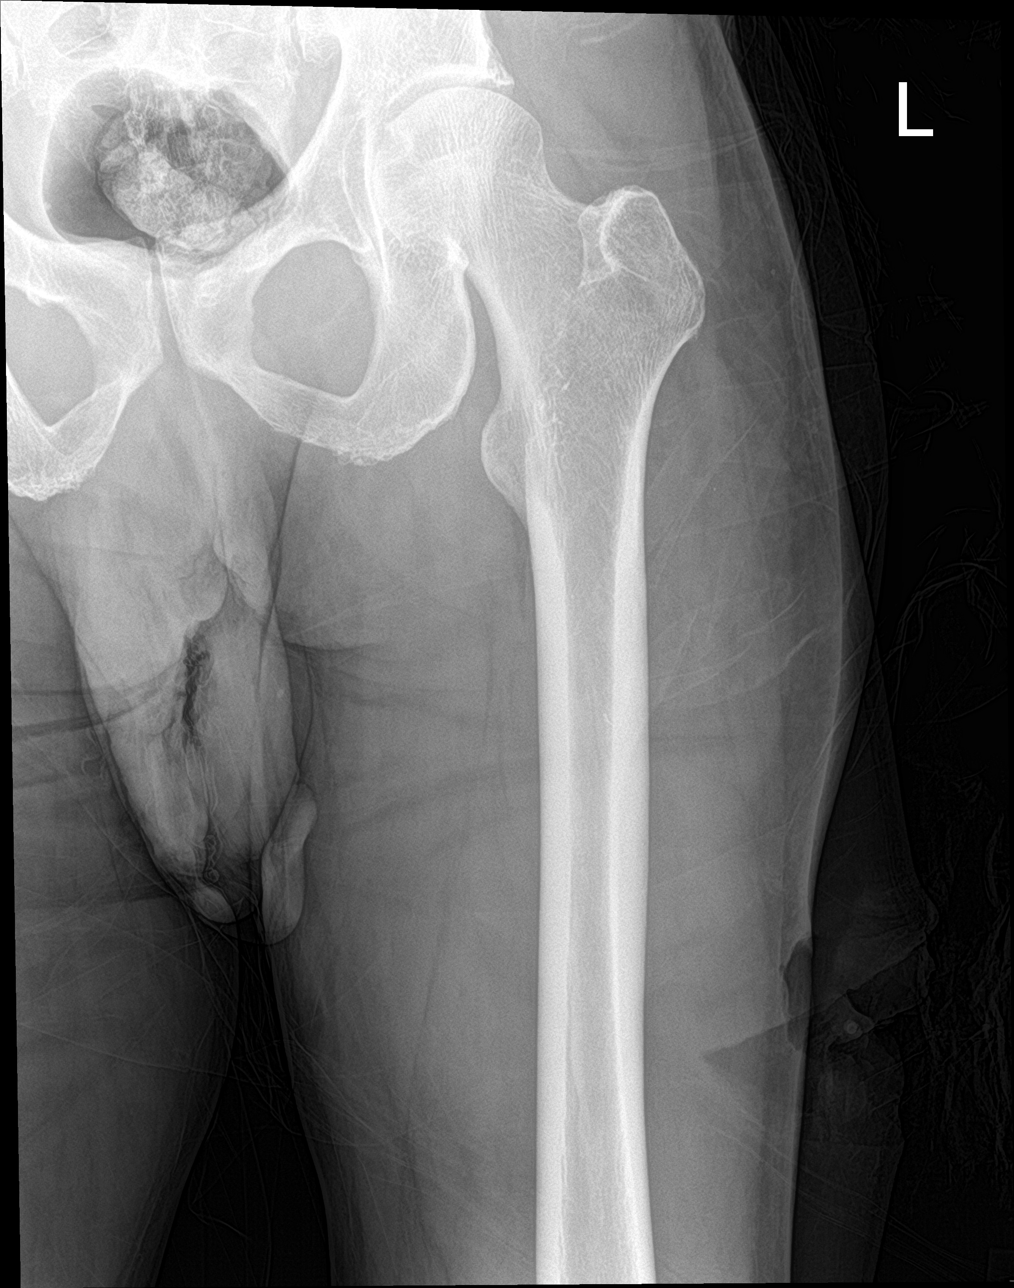

[femur lat (1 of 2)]
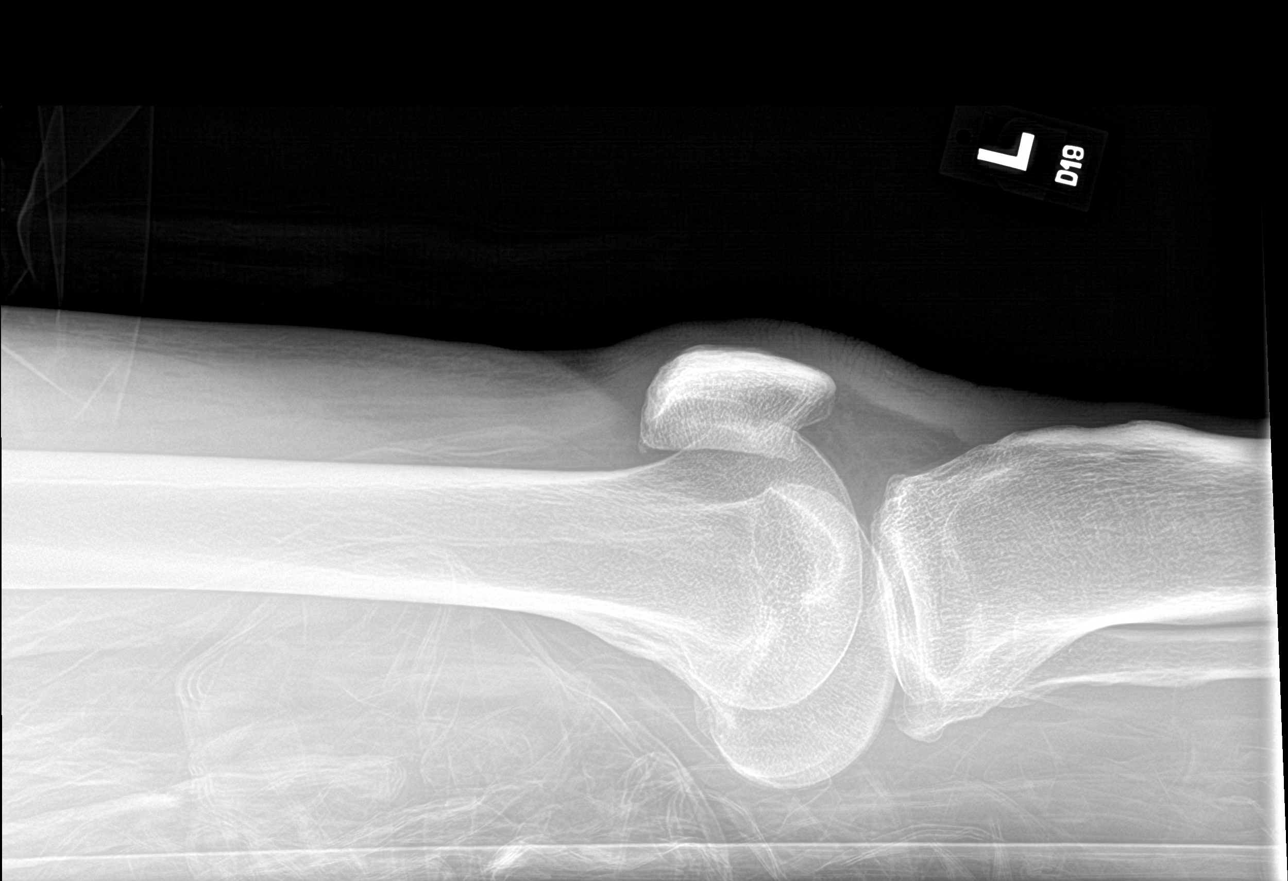

[femur ap (2 of 2)]
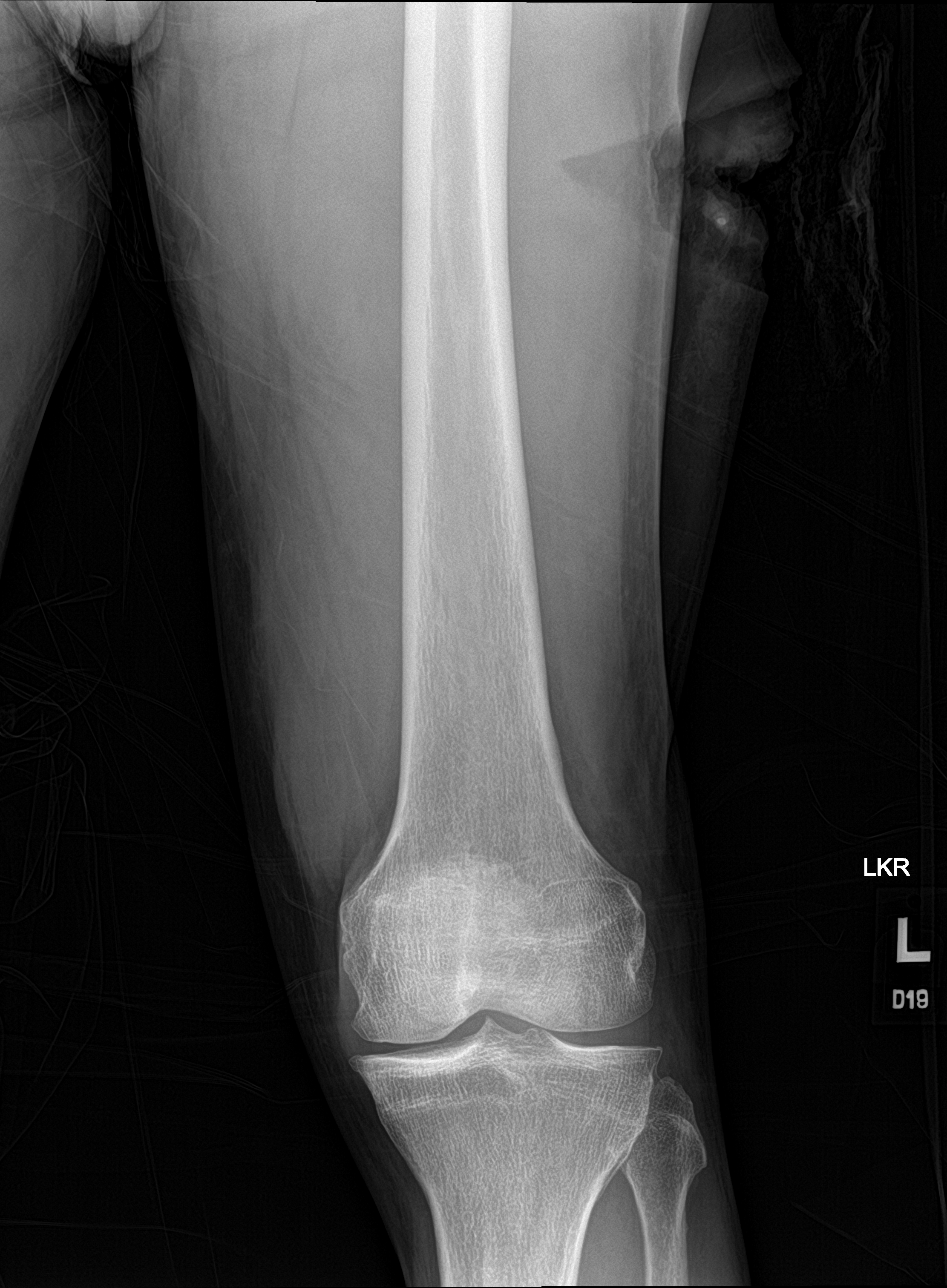

[femur lat (2 of 2)]
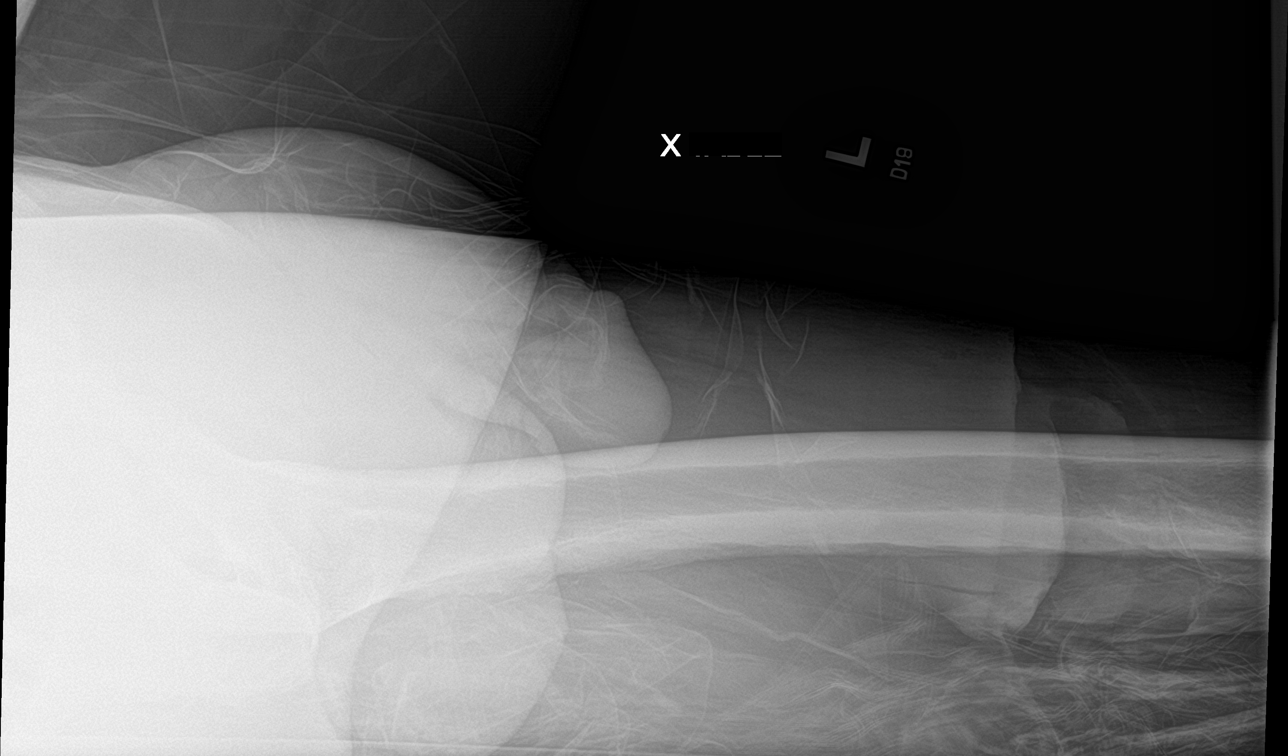

[4 of 4 positions shown; findings below may reference images not displayed]

FINDINGS: AP and cross-table lateral views. There is a large v-shaped soft
tissue injury along the lateral mid femoral shaft. On image 3 there
is a small 3-4 mm round radiopaque focus within the wound. The
deepest portion of the injury of appears about 1 cm from the bone.
The underlying left femur appears intact. Bone mineralization is
within normal limits. Alignment at the left hip and knee is
maintained. Medial compartment joint space loss and degenerative
spurring.
IMPRESSION: 1. Large soft tissue injury lateral to the mid left femoral shaft.
Possible small 3-4 mm radiopaque foreign body (image 3). The wound
extends to about 1 cm from the bone.
2. No underlying osseous injury.  Left hip and knee degeneration.

## 2022-11-26 DIAGNOSIS — Z419 Encounter for procedure for purposes other than remedying health state, unspecified: Secondary | ICD-10-CM | POA: Diagnosis not present

## 2022-12-27 DIAGNOSIS — Z419 Encounter for procedure for purposes other than remedying health state, unspecified: Secondary | ICD-10-CM | POA: Diagnosis not present

## 2023-01-26 DIAGNOSIS — Z419 Encounter for procedure for purposes other than remedying health state, unspecified: Secondary | ICD-10-CM | POA: Diagnosis not present

## 2023-02-26 DIAGNOSIS — Z419 Encounter for procedure for purposes other than remedying health state, unspecified: Secondary | ICD-10-CM | POA: Diagnosis not present

## 2023-03-28 DIAGNOSIS — Z419 Encounter for procedure for purposes other than remedying health state, unspecified: Secondary | ICD-10-CM | POA: Diagnosis not present

## 2023-10-02 ENCOUNTER — Emergency Department (HOSPITAL_COMMUNITY): Payer: Medicaid Other

## 2023-10-02 ENCOUNTER — Encounter (HOSPITAL_COMMUNITY): Payer: Self-pay

## 2023-10-02 ENCOUNTER — Other Ambulatory Visit: Payer: Self-pay

## 2023-10-02 ENCOUNTER — Emergency Department (HOSPITAL_COMMUNITY)
Admission: EM | Admit: 2023-10-02 | Discharge: 2023-10-02 | Disposition: A | Discharge status: Home / Self Care | Payer: Medicaid Other | Attending: Emergency Medicine | Admitting: Emergency Medicine

## 2023-10-02 DIAGNOSIS — M436 Torticollis: Secondary | ICD-10-CM | POA: Insufficient documentation

## 2023-10-02 DIAGNOSIS — I10 Essential (primary) hypertension: Secondary | ICD-10-CM | POA: Insufficient documentation

## 2023-10-02 DIAGNOSIS — M542 Cervicalgia: Secondary | ICD-10-CM | POA: Diagnosis present

## 2023-10-02 MED ORDER — HYDROCODONE-ACETAMINOPHEN 5-325 MG PO TABS
1.0000 | ORAL_TABLET | Freq: Once | ORAL | Status: AC
  Administered 2023-10-02: 1 via ORAL
  Filled 2023-10-02: qty 1

## 2023-10-02 MED ORDER — METHOCARBAMOL 500 MG PO TABS: 500.0000 mg | ORAL_TABLET | Freq: Two times a day (BID) | ORAL | 0 refills | Status: AC

## 2023-10-02 MED ORDER — ETODOLAC 400 MG PO TABS: 400.0000 mg | ORAL_TABLET | Freq: Two times a day (BID) | ORAL | 0 refills | Status: AC

## 2023-10-02 MED ORDER — HYDROCODONE-ACETAMINOPHEN 5-325 MG PO TABS: 1.0000 | ORAL_TABLET | Freq: Four times a day (QID) | ORAL | 0 refills | Status: AC | PRN

## 2023-10-02 NOTE — ED Notes (Signed)
 Pt to xray, well appearing.

## 2023-10-02 NOTE — ED Notes (Signed)
This RN reviewed discharge instructions with patient. He verbalized understanding and denied any further questions. PT well appearing upon discharge and reports tolerable pain. Pt ambulated with stable gait to exit. Pt endorses ride home.  

## 2023-10-02 NOTE — Discharge Instructions (Signed)
 Take the medications as prescribed to help with the pain and muscle spasm in your neck.  I did write for a few hydrocodone tablets that you can take for severe pain or at nighttime

## 2023-10-02 NOTE — ED Provider Notes (Signed)
 [place] [facility] Provider Note   CSN: [id number] Arrival date & time: 10/02/23  0201     History  Chief Complaint  Patient presents with   Shoulder Pain   Torticollis    Ivan Murphy is a 63 y.o. male.   Shoulder Pain    Patient has a history of hypertension who presents ED with complaints of neck pain.  Patient states he started having symptoms a couple days ago.  He just woke up and noticed that his neck was sore.  Pain increases with palpation and turning his head and neck he is having pain to the right side of his neck it goes towards his shoulder.  He denies any fevers or chills.  No chest pain or shortness of breath.  No numbness or weakness  Home Medications Prior to Admission medications   Medication Sig Start Date End Date Taking? Authorizing Provider  etodolac  (LODINE ) 400 MG tablet Take 1 tablet (400 mg total) by mouth 2 (two) times daily. 10/02/23  Yes Randol Simmonds, MD  HYDROcodone -acetaminophen  (NORCO/VICODIN) 5-325 MG tablet Take 1 tablet by mouth every 6 (six) hours as needed for severe pain (pain score 7-10). 10/02/23  Yes Randol Simmonds, MD  methocarbamol  (ROBAXIN ) 500 MG tablet Take 1 tablet (500 mg total) by mouth 2 (two) times daily. 10/02/23  Yes Randol Simmonds, MD  acetaminophen  (TYLENOL ) 325 MG tablet Take 650 mg by mouth every 6 (six) hours as needed for headache.    [provider]  amoxicillin -clavulanate (AUGMENTIN ) 875-125 MG tablet Take 1 tablet by mouth every 12 (twelve) hours. 05/24/20   [name], MD  cyclobenzaprine  (FLEXERIL ) 10 MG tablet Take 1 tablet (10 mg total) by mouth 3 (three) times daily as needed. 05/09/20   Triplett, Tammy, PA-C  oxyCODONE -acetaminophen  (PERCOCET/ROXICET) 5-325 MG tablet Take 1 tablet by mouth every 6 (six) hours as needed for severe pain. 05/14/20   Windle Almarie ORN, PA-C  oxyCODONE -acetaminophen  (PERCOCET/ROXICET) 5-325 MG tablet Take 1 tablet by mouth every 6 (six) hours as  needed for severe pain. 05/14/20   Windle Almarie ORN, PA-C      Allergies    Patient has no known allergies.    Review of Systems   Review of Systems  Physical Exam Updated Vital Signs BP (!) 149/81 (BP Location: Right Arm)   Pulse 64   Temp 98.6 F (37 C) (Oral)   Resp 16   Ht 1.702 m (5' 7)   Wt 89.8 kg   SpO2 95%   BMI 31.01 kg/m  Physical Exam Vitals and nursing note reviewed.  Constitutional:      General: He is not in acute distress.    Appearance: He is well-developed.  HENT:     Head: Normocephalic and atraumatic.     Right Ear: External ear normal.     Left Ear: External ear normal.  Eyes:     General: No scleral icterus.       Right eye: No discharge.        Left eye: No discharge.     Conjunctiva/sclera: Conjunctivae normal.  Neck:     Trachea: No tracheal deviation.     Comments: Tenderness palpation right paraspinal region Cardiovascular:     Rate and Rhythm: Normal rate.  Pulmonary:     Effort: Pulmonary effort is normal. No respiratory distress.     Breath sounds: No stridor.  Abdominal:     General: There is no distension.  Musculoskeletal:  General: No swelling or deformity.     Cervical back: Neck supple. Tenderness present.     Comments: Able to move the right shoulder without pain or discomfort, no swelling or erythema noted  Lymphadenopathy:     Cervical: No cervical adenopathy.  Skin:    General: Skin is warm and dry.     Findings: No rash.  Neurological:     Mental Status: He is alert. Mental status is at baseline.     Cranial Nerves: No dysarthria or facial asymmetry.     Motor: No seizure activity.     ED Results / Procedures / Treatments   Labs (all labs ordered are listed, but only abnormal results are displayed) Labs Reviewed - No data to display  EKG None  Radiology DG Cervical Spine Complete Result Date: 10/02/2023 CLINICAL DATA:  neck pain, no trauma EXAM: CERVICAL SPINE - COMPLETE 4+ VIEW COMPARISON:   Cervical spine CT 02/14/2020. FINDINGS: The prevertebral soft tissues are normal. The alignment is anatomic through T1 aside from a mild convex right scoliosis. There is no evidence of acute fracture or traumatic subluxation. The C1-2 articulation appears normal in the AP projection. There is multilevel spondylosis with disc space narrowing, uncinate spurring and facet hypertrophy, similar to previous CT. Mild foraminal narrowing bilaterally, greatest at C4-5 and C5-6. Ossification of the ligamentum nuchae a and bilateral carotid atherosclerosis noted. IMPRESSION: No acute osseous findings or significant malalignment. Multilevel cervical spondylosis, similar to previous CT. Electronically Signed   By: Elsie Perone M.D.   On: 10/02/2023 08:26   DG Shoulder Right Port Result Date: 10/02/2023 CLINICAL DATA:  Right shoulder pain EXAM: RIGHT SHOULDER - 3 VIEW COMPARISON:  None Available. FINDINGS: There is no evidence of fracture or dislocation. Degenerative spurring at the [facility] joint which is narrow. Tiny calcification at the rotator cuff, discrete and favored chronic rather than from calcific tendinitis. Scapular Y-view is degraded by motion artifact. IMPRESSION: 1. No acute osseous finding. 2. Acromioclavicular osteoarthritis. 3. Tiny calcification at the rotator cuff which could be chronic or from early calcific tendinitis. Electronically Signed   By: Dorn Roulette M.D.   On: 10/02/2023 05:36    Procedures Procedures    Medications Ordered in ED Medications  HYDROcodone -acetaminophen  (NORCO/VICODIN) 5-325 MG per tablet 1 tablet (1 tablet Oral Given 10/02/23 0829)    ED Course/ Medical Decision Making/ A&P Clinical Course as of 10/02/23 0851  Sun Oct 02, 2023  0837 Cervical films do not show any acute abnormality.  Patient does have multilevel cervical spondylosis [JK]    Clinical Course User Index [JK] Randol Simmonds, MD                                 Medical Decision Making Problems  Addressed: Torticollis, acute: acute illness or injury that poses a threat to life or bodily functions  Amount and/or Complexity of Data Reviewed Radiology: ordered and independent interpretation performed.  Risk Prescription drug management.   Patient presents ED for evaluation of neck pain.  Patient is not have any fevers or chills.  Low suspicion for infectious etiology such as meningitis.  No focal swelling to suggest abscess.  Patient does not have any lymphadenopathy.  X-rays do not show any acute bony abnormalities.  Patient does have spondylosis.  I suspect his symptoms are related to muscle spasm and torticollis.  Will discharge home with medications for pain and muscle relaxant.  Discussed  outpatient follow-up       Final Clinical Impression(s) / ED Diagnoses Final diagnoses:  Torticollis, acute    Rx / DC Orders ED Discharge Orders          Ordered    etodolac  (LODINE ) 400 MG tablet  2 times daily        10/02/23 0849    methocarbamol  (ROBAXIN ) 500 MG tablet  2 times daily        10/02/23 0849    HYDROcodone -acetaminophen  (NORCO/VICODIN) 5-325 MG tablet  Every 6 hours PRN        10/02/23 0849              Randol Simmonds, MD 10/02/23 [phone]

## 2023-10-02 NOTE — ED Notes (Signed)
 Pt back from xray. Well appearing.

## 2023-10-02 NOTE — ED Triage Notes (Addendum)
 Pt reports R neck stiffness and shoulder pain x2 days. Pt states pain starts in his head and radiates to neck. No CP/SHOB.

## 2023-12-11 ENCOUNTER — Encounter (HOSPITAL_COMMUNITY): Payer: Self-pay

## 2023-12-11 ENCOUNTER — Emergency Department (HOSPITAL_COMMUNITY): Payer: Self-pay

## 2023-12-11 ENCOUNTER — Encounter (HOSPITAL_COMMUNITY): Payer: Self-pay | Admitting: *Deleted

## 2023-12-11 ENCOUNTER — Ambulatory Visit (HOSPITAL_COMMUNITY)
Admission: EM | Admit: 2023-12-11 | Discharge: 2023-12-11 | Disposition: A | Discharge status: Home / Self Care | Payer: Self-pay

## 2023-12-11 ENCOUNTER — Emergency Department (HOSPITAL_COMMUNITY)
Admission: EM | Admit: 2023-12-11 | Discharge: 2023-12-12 | Disposition: A | Discharge status: Home / Self Care | Payer: Self-pay | Attending: Emergency Medicine | Admitting: Emergency Medicine

## 2023-12-11 ENCOUNTER — Other Ambulatory Visit: Payer: Self-pay

## 2023-12-11 DIAGNOSIS — L03119 Cellulitis of unspecified part of limb: Secondary | ICD-10-CM

## 2023-12-11 DIAGNOSIS — L02512 Cutaneous abscess of left hand: Secondary | ICD-10-CM | POA: Insufficient documentation

## 2023-12-11 MED ORDER — LIDOCAINE HCL (PF) 1 % IJ SOLN
30.0000 mL | Freq: Once | INTRAMUSCULAR | Status: AC
  Administered 2023-12-11: 30 mL
  Filled 2023-12-11: qty 30

## 2023-12-11 NOTE — ED Provider Notes (Signed)
 [place] [facility] DEPARTMENT AT [facility] Provider Note   CSN: [id number] Arrival date & time: 12/11/23  1614     History {Add pertinent medical, surgical, social history, OB history to HPI:1} Chief Complaint  Patient presents with   Lt hand swelling    Ivan Murphy is a 63 y.o. male.  Patient was referred to the emergency department from urgent care.  Patient reports 2 to 3 days of pain and swelling on the left index finger.  He denies any known injury.  He has been putting peroxide on there and has gotten some white pus out of it.       Home Medications Prior to Admission medications   Medication Sig Start Date End Date Taking? Authorizing Provider  acetaminophen (TYLENOL) 325 MG tablet Take 650 mg by mouth every 6 (six) hours as needed for headache.    [provider]  amoxicillin-clavulanate (AUGMENTIN) 875-125 MG tablet Take 1 tablet by mouth every 12 (twelve) hours. 05/24/20   Benjiman Core, MD  cyclobenzaprine (FLEXERIL) 10 MG tablet Take 1 tablet (10 mg total) by mouth 3 (three) times daily as needed. 05/09/20   Triplett, Tammy, PA-C  etodolac (LODINE) 400 MG tablet Take 1 tablet (400 mg total) by mouth 2 (two) times daily. 10/02/23   Linwood Dibbles, MD  HYDROcodone-acetaminophen (NORCO/VICODIN) 5-325 MG tablet Take 1 tablet by mouth every 6 (six) hours as needed for severe pain (pain score 7-10). 10/02/23   Linwood Dibbles, MD  methocarbamol (ROBAXIN) 500 MG tablet Take 1 tablet (500 mg total) by mouth 2 (two) times daily. 10/02/23   Linwood Dibbles, MD  oxyCODONE-acetaminophen (PERCOCET/ROXICET) 5-325 MG tablet Take 1 tablet by mouth every 6 (six) hours as needed for severe pain. 05/14/20   Cristina Gong, PA-C  oxyCODONE-acetaminophen (PERCOCET/ROXICET) 5-325 MG tablet Take 1 tablet by mouth every 6 (six) hours as needed for severe pain. 05/14/20   Cristina Gong, PA-C      Allergies    Patient has no known allergies.    Review of Systems   Review  of Systems  Physical Exam Updated Vital Signs BP (!) 154/92 (BP Location: Right Arm)   Pulse 84   Temp 98.3 F (36.8 C)   Resp 17   Ht 5\' 7"  (1.702 m)   Wt 90.7 kg   SpO2 94%   BMI 31.32 kg/m  Physical Exam Vitals and nursing note reviewed.  Constitutional:      General: He is not in acute distress.    Appearance: He is well-developed.  HENT:     Head: Normocephalic and atraumatic.     Mouth/Throat:     Mouth: Mucous membranes are moist.  Eyes:     General: Vision grossly intact. Gaze aligned appropriately.     Extraocular Movements: Extraocular movements intact.     Conjunctiva/sclera: Conjunctivae normal.  Cardiovascular:     Rate and Rhythm: Normal rate and regular rhythm.     Pulses: Normal pulses.     Heart sounds: Normal heart sounds, S1 normal and S2 normal. No murmur heard.    No friction rub. No gallop.  Pulmonary:     Effort: Pulmonary effort is normal. No respiratory distress.     Breath sounds: Normal breath sounds.  Abdominal:     Palpations: Abdomen is soft.     Tenderness: There is no abdominal tenderness. There is no guarding or rebound.     Hernia: No hernia is present.  Musculoskeletal:  General: No swelling.     Left hand: Swelling (Proximal phalanx left index finger, dorsal) present.     Cervical back: Full passive range of motion without pain, normal range of motion and neck supple. No pain with movement, spinous process tenderness or muscular tenderness. Normal range of motion.     Right lower leg: No edema.     Left lower leg: No edema.     Comments: 0 out of 4 signs of kanavel  Skin:    General: Skin is warm and dry.     Capillary Refill: Capillary refill takes less than 2 seconds.     Findings: No ecchymosis, erythema, lesion or wound.  Neurological:     Mental Status: He is alert and oriented to person, place, and time.     GCS: GCS eye subscore is 4. GCS verbal subscore is 5. GCS motor subscore is 6.     Cranial Nerves: Cranial  nerves 2-12 are intact.     Sensory: Sensation is intact.     Motor: Motor function is intact. No weakness or abnormal muscle tone.     Coordination: Coordination is intact.  Psychiatric:        Mood and Affect: Mood normal.        Speech: Speech normal.        Behavior: Behavior normal.     ED Results / Procedures / Treatments   Labs (all labs ordered are listed, but only abnormal results are displayed) Labs Reviewed - No data to display  EKG None  Radiology DG Hand Complete Left Result Date: 12/11/2023 CLINICAL DATA:  Left hand swelling predominantly around the index and middle finger. No injury. EXAM: LEFT HAND - COMPLETE 3+ VIEW COMPARISON:  None Available. FINDINGS: No fracture or bone lesion. Joints are normally spaced and aligned. Index finger soft tissue swelling. Also soft tissue swelling across the dorsum of the hand. No soft tissue air. IMPRESSION: 1. No fracture or acute bone abnormality. 2. Soft tissue swelling. No soft tissue air. Electronically Signed   By: Amie Portland M.D.   On: 12/11/2023 17:07    Procedures Procedures  {Document cardiac monitor, telemetry assessment procedure when appropriate:1}  Medications Ordered in ED Medications  lidocaine (PF) (XYLOCAINE) 1 % injection 30 mL (has no administration in time range)    ED Course/ Medical Decision Making/ A&P   {   Click here for ABCD2, HEART and other calculatorsREFRESH Note before signing :1}                              Medical Decision Making Amount and/or Complexity of Data Reviewed Radiology: ordered.  Risk Prescription drug management.   ***  {Document critical care time when appropriate:1} {Document review of labs and clinical decision tools ie heart score, Chads2Vasc2 etc:1}  {Document your independent review of radiology images, and any outside records:1} {Document your discussion with family members, caretakers, and with consultants:1} {Document social determinants of health  affecting pt's care:1} {Document your decision making why or why not admission, treatments were needed:1} Final Clinical Impression(s) / ED Diagnoses Final diagnoses:  None    Rx / DC Orders ED Discharge Orders     None

## 2023-12-11 NOTE — ED Notes (Signed)
 Patient is being discharged from the Urgent Care and sent to the Emergency Department via personal opperated vehicle . Per Pola Corn B, NP, patient is in need of higher level of care due to possible cellulitis. Patient is aware and verbalizes understanding of plan of care.  Vitals:   12/11/23 1549  BP: (!) 144/78  Pulse: 85  Resp: 18  Temp: 98.3 F (36.8 C)  SpO2: 94%

## 2023-12-11 NOTE — ED Triage Notes (Signed)
 Pt came in via POV d/t his Lt hand swelling, primarily around his index & middle finger. Pt states UC sent him here. Denies any recent injuries, states he noticed the swelling 3 days ago. A/Ox4, rates his pain 10/10 during triage.

## 2023-12-11 NOTE — Discharge Instructions (Addendum)
 Dorsal hand cellulitis -Severe cellulitis of the dorsum of the left hand noted on exam.  Patient reports unknown injury. -Strong concern for tenosynovitis secondary to infection. -Possible need for IV antibiotics, stat blood work. -Recommend follow-up in the emergency department for further evaluation and management of dorsal hand cellulitis and possible tenosynovitis.

## 2023-12-11 NOTE — ED Triage Notes (Signed)
 Pt has swelling to Lt index finger. Pt reports drainage form site on Lt index finger.

## 2023-12-11 NOTE — ED Provider Notes (Signed)
 [facility]  Note:  This document was prepared using Dragon voice recognition software and may include unintentional dictation errors.  MRN: [id number] DOB: 12/28/1960  Subjective:   Ivan Murphy is a 63 y.o. male presenting for unknown injury to left index finger and hand.  Patient reports severe swelling, warmth, erythema and pain to the dorsum of the left hand following an injury to his left index finger.  Patient has a small scab on the index finger that he does not know where it came from.  Patient states that he is having increased pain and difficulty using his hand due to the swelling.  Patient denies fever.  No current facility-administered medications for this encounter.  Current Outpatient Medications:    acetaminophen (TYLENOL) 325 MG tablet, Take 650 mg by mouth every 6 (six) hours as needed for headache., Disp: , Rfl:    amoxicillin-clavulanate (AUGMENTIN) 875-125 MG tablet, Take 1 tablet by mouth every 12 (twelve) hours., Disp: 6 tablet, Rfl: 0   cyclobenzaprine (FLEXERIL) 10 MG tablet, Take 1 tablet (10 mg total) by mouth 3 (three) times daily as needed., Disp: 21 tablet, Rfl: 0   etodolac (LODINE) 400 MG tablet, Take 1 tablet (400 mg total) by mouth 2 (two) times daily., Disp: 14 tablet, Rfl: 0   HYDROcodone-acetaminophen (NORCO/VICODIN) 5-325 MG tablet, Take 1 tablet by mouth every 6 (six) hours as needed for severe pain (pain score 7-10)., Disp: 8 tablet, Rfl: 0   methocarbamol (ROBAXIN) 500 MG tablet, Take 1 tablet (500 mg total) by mouth 2 (two) times daily., Disp: 20 tablet, Rfl: 0   oxyCODONE-acetaminophen (PERCOCET/ROXICET) 5-325 MG tablet, Take 1 tablet by mouth every 6 (six) hours as needed for severe pain., Disp: 6 tablet, Rfl: 0   oxyCODONE-acetaminophen (PERCOCET/ROXICET) 5-325 MG tablet, Take 1 tablet by mouth every 6 (six) hours as needed for severe pain., Disp: 5 tablet, Rfl: 0   No Known Allergies  Past Medical History:  Diagnosis Date    Abnormal EKG, felt to be due to hypertension  02/28/2013   Chest pain at rest 02/26/2013   Headache(784.0)    Hypertension    S/P cardiac cath, 02/28/13 02/28/2013     Past Surgical History:  Procedure Laterality Date   CARDIAC CATHETERIZATION  02/28/2013   IRRIGATION AND DEBRIDEMENT ABSCESS     LEFT HEART CATHETERIZATION WITH CORONARY ANGIOGRAM N/A 02/28/2013   Procedure: LEFT HEART CATHETERIZATION WITH CORONARY ANGIOGRAM;  Surgeon: Runell Gess, MD;  Location: [facility] CATH LAB;  Service: Cardiovascular;  Laterality: N/A;    Family History  Problem Relation Age of Onset   Diabetes Brother     Social History   Tobacco Use   Smoking status: Every Day    Current packs/day: 0.50    Average packs/day: 0.5 packs/day for 39.0 years (19.5 ttl pk-yrs)    Types: Cigarettes   Smokeless tobacco: Never  Vaping Use   Vaping status: Never Used  Substance Use Topics   Alcohol use: Yes   Drug use: Not Currently    Types: Cocaine    Comment: last used 05/19/2019    ROS Refer to HPI for ROS details.  Objective:   Vitals: BP (!) 144/78   Pulse 85   Temp 98.3 F (36.8 C)   Resp 18   SpO2 94%   Physical Exam Vitals and nursing note reviewed.  Constitutional:      General: He is not in acute distress.    Appearance: Normal appearance. He is well-developed. He  is not ill-appearing or toxic-appearing.  HENT:     Head: Normocephalic.  Cardiovascular:     Rate and Rhythm: Normal rate.  Pulmonary:     Effort: Pulmonary effort is normal. No respiratory distress.  Musculoskeletal:        General: No swelling.     Left hand: Swelling and tenderness present. Decreased range of motion. Decreased capillary refill.       Hands:  Skin:    General: Skin is warm and dry.     Capillary Refill: Capillary refill takes less than 2 seconds.     Findings: Erythema and wound (Small wound noted to left index finger with severe swelling and erythema to the finger and dorsum of the hand) present. No  abrasion or lesion.  Neurological:     General: No focal deficit present.     Mental Status: He is alert and oriented to person, place, and time.  Psychiatric:        Mood and Affect: Mood normal.     Procedures  No results found for this or any previous visit (from the past 24 hours).  Assessment and Plan :   PDMP not reviewed this encounter.  1. Cellulitis of dorsum of hand    Dorsal hand cellulitis -Severe cellulitis of the dorsum of the left hand noted on exam.  Patient reports unknown injury. -Strong concern for tenosynovitis secondary to infection. -Possible need for IV antibiotics, stat blood work. -Recommend follow-up in the emergency department for further evaluation and management of dorsal hand cellulitis and possible tenosynovitis.  [place]boy   Weinert, [place], [place] 12/11/23 1611

## 2023-12-12 MED ORDER — CLINDAMYCIN PHOSPHATE 900 MG/6ML IJ SOLN
600.0000 mg | Freq: Once | INTRAMUSCULAR | Status: AC
  Administered 2023-12-12: 600 mg via INTRAMUSCULAR
  Filled 2023-12-12: qty 4

## 2023-12-12 MED ORDER — CLINDAMYCIN HCL 150 MG PO CAPS: 300.0000 mg | ORAL_CAPSULE | Freq: Four times a day (QID) | ORAL | 0 refills | Status: DC

## 2023-12-13 ENCOUNTER — Other Ambulatory Visit: Payer: Self-pay

## 2023-12-13 ENCOUNTER — Emergency Department (HOSPITAL_COMMUNITY)
Admission: EM | Admit: 2023-12-13 | Discharge: 2023-12-13 | Disposition: A | Discharge status: Home / Self Care | Payer: Self-pay | Attending: Emergency Medicine | Admitting: Emergency Medicine

## 2023-12-13 ENCOUNTER — Emergency Department (HOSPITAL_COMMUNITY): Payer: Self-pay

## 2023-12-13 ENCOUNTER — Encounter (HOSPITAL_COMMUNITY): Payer: Self-pay | Admitting: *Deleted

## 2023-12-13 ENCOUNTER — Ambulatory Visit (HOSPITAL_COMMUNITY)
Admission: EM | Admit: 2023-12-13 | Discharge: 2023-12-13 | Disposition: A | Discharge status: Home / Self Care | Payer: Self-pay

## 2023-12-13 ENCOUNTER — Encounter (HOSPITAL_COMMUNITY): Payer: Self-pay

## 2023-12-13 DIAGNOSIS — L03012 Cellulitis of left finger: Secondary | ICD-10-CM

## 2023-12-13 DIAGNOSIS — I10 Essential (primary) hypertension: Secondary | ICD-10-CM | POA: Insufficient documentation

## 2023-12-13 DIAGNOSIS — L0291 Cutaneous abscess, unspecified: Secondary | ICD-10-CM

## 2023-12-13 DIAGNOSIS — L02512 Cutaneous abscess of left hand: Secondary | ICD-10-CM | POA: Insufficient documentation

## 2023-12-13 LAB — CBC WITH DIFFERENTIAL/PLATELET
Abs Immature Granulocytes: 0.05 10*3/uL (ref 0.00–0.07)
Basophils Absolute: 0 10*3/uL (ref 0.0–0.1)
Basophils Relative: 0 %
Eosinophils Absolute: 0.5 10*3/uL (ref 0.0–0.5)
Eosinophils Relative: 5 %
HCT: 42.5 % (ref 39.0–52.0)
Hemoglobin: 14.1 g/dL (ref 13.0–17.0)
Immature Granulocytes: 1 %
Lymphocytes Relative: 19 %
Lymphs Abs: 1.8 10*3/uL (ref 0.7–4.0)
MCH: 26.6 pg (ref 26.0–34.0)
MCHC: 33.2 g/dL (ref 30.0–36.0)
MCV: 80 fL (ref 80.0–100.0)
Monocytes Absolute: 1 10*3/uL (ref 0.1–1.0)
Monocytes Relative: 10 %
Neutro Abs: 6 10*3/uL (ref 1.7–7.7)
Neutrophils Relative %: 65 %
Platelets: 189 10*3/uL (ref 150–400)
RBC: 5.31 MIL/uL (ref 4.22–5.81)
RDW: 14.7 % (ref 11.5–15.5)
WBC: 9.3 10*3/uL (ref 4.0–10.5)
nRBC: 0 % (ref 0.0–0.2)

## 2023-12-13 LAB — BASIC METABOLIC PANEL
Anion gap: 10 (ref 5–15)
BUN: 12 mg/dL (ref 8–23)
CO2: 22 mmol/L (ref 22–32)
Calcium: 8.9 mg/dL (ref 8.9–10.3)
Chloride: 102 mmol/L (ref 98–111)
Creatinine, Ser: 0.81 mg/dL (ref 0.61–1.24)
GFR, Estimated: >60 mL/min (ref >60)
Glucose, Bld: 118 mg/dL — ABNORMAL HIGH (ref 70–99)
Potassium: 4.3 mmol/L (ref 3.5–5.1)
Sodium: 134 mmol/L — ABNORMAL LOW (ref 135–145)

## 2023-12-13 MED ORDER — CLINDAMYCIN HCL 150 MG PO CAPS
300.0000 mg | ORAL_CAPSULE | Freq: Once | ORAL | Status: AC
  Administered 2023-12-13: 300 mg via ORAL
  Filled 2023-12-13: qty 2

## 2023-12-13 MED ORDER — LIDOCAINE-EPINEPHRINE (PF) 2 %-1:200000 IJ SOLN
10.0000 mL | Freq: Once | INTRAMUSCULAR | Status: AC
  Administered 2023-12-13: 10 mL
  Filled 2023-12-13: qty 20

## 2023-12-13 NOTE — ED Provider Notes (Signed)
 [place] EMERGENCY DEPARTMENT AT [facility] Provider Note   CSN: [id number] Arrival date & time: 12/13/23  1249     History  No chief complaint on file.   Ivan Murphy is a 63 y.o. male.  HPI   63 year old male presents emergency department with complaints of right hand pain/swelling/redness.  Has been having symptoms for the past 5 to 6 days.  Was seen in the ER on 12/11/23 and had I&D performed and was placed on clindamycin.  Patient states that he did not take off the dressing until yesterday and did not pick up the antibiotics at the pharmacy.  Denies any fevers, chills.  States that the swelling as well as redness is worse.  Has still been noticing some pus drainage from the site where the incision was made.  Denies history of IV drug use or known trauma/injuries.  States he does not know exactly what caused the area to become infected in the first place.  Past medical history significant for hypertension, headache, cocaine abuse, MDD, chest pain  Home Medications Prior to Admission medications   Medication Sig Start Date End Date Taking? Authorizing Provider  acetaminophen (TYLENOL) 325 MG tablet Take 650 mg by mouth every 6 (six) hours as needed for headache.    [provider]  clindamycin (CLEOCIN) 150 MG capsule Take 2 capsules (300 mg total) by mouth 4 (four) times daily. 12/12/23   Gilda Crease, MD  cyclobenzaprine (FLEXERIL) 10 MG tablet Take 1 tablet (10 mg total) by mouth 3 (three) times daily as needed. 05/09/20   Triplett, Tammy, PA-C  etodolac (LODINE) 400 MG tablet Take 1 tablet (400 mg total) by mouth 2 (two) times daily. 10/02/23   Linwood Dibbles, MD  HYDROcodone-acetaminophen (NORCO/VICODIN) 5-325 MG tablet Take 1 tablet by mouth every 6 (six) hours as needed for severe pain (pain score 7-10). 10/02/23   Linwood Dibbles, MD  methocarbamol (ROBAXIN) 500 MG tablet Take 1 tablet (500 mg total) by mouth 2 (two) times daily. 10/02/23   Linwood Dibbles, MD   oxyCODONE-acetaminophen (PERCOCET/ROXICET) 5-325 MG tablet Take 1 tablet by mouth every 6 (six) hours as needed for severe pain. 05/14/20   Cristina Gong, PA-C  oxyCODONE-acetaminophen (PERCOCET/ROXICET) 5-325 MG tablet Take 1 tablet by mouth every 6 (six) hours as needed for severe pain. 05/14/20   Cristina Gong, PA-C      Allergies    Patient has no known allergies.    Review of Systems   Review of Systems  All other systems reviewed and are negative.   Physical Exam Updated Vital Signs BP (!) 158/93 (BP Location: Right Arm)   Pulse 74   Temp 98.4 F (36.9 C)   Resp 18   Ht 5\' 7"  (1.702 m)   Wt 90.7 kg   SpO2 96%   BMI 31.32 kg/m  Physical Exam Vitals and nursing note reviewed.  Constitutional:      General: He is not in acute distress.    Appearance: He is well-developed.  HENT:     Head: Normocephalic and atraumatic.  Eyes:     Conjunctiva/sclera: Conjunctivae normal.  Cardiovascular:     Rate and Rhythm: Normal rate and regular rhythm.     Heart sounds: No murmur heard. Pulmonary:     Effort: Pulmonary effort is normal. No respiratory distress.     Breath sounds: Normal breath sounds.  Abdominal:     Palpations: Abdomen is soft.     Tenderness: There  is no abdominal tenderness.  Musculoskeletal:        General: No swelling.     Cervical back: Neck supple.     Comments: Patient with swelling and erythema noted to dorsal aspect of patient's left hand.  Appreciable area of palpable fluctuance dorsal proximal phalanx of second digit of left hand.  Able to express purulent drainage from area.  Patient able to flex digit about halfway before begins to cause him discomfort.  Able to extend fully.  Capillary fill less than 2 seconds.  Radial pulses 2+ bilaterally.  Skin:    General: Skin is warm and dry.     Capillary Refill: Capillary refill takes less than 2 seconds.  Neurological:     Mental Status: He is alert.  Psychiatric:        Mood and Affect:  Mood normal.            ED Results / Procedures / Treatments   Labs (all labs ordered are listed, but only abnormal results are displayed) Labs Reviewed  BASIC METABOLIC PANEL - Abnormal; Notable for the following components:      Result Value   Sodium 134 (*)    Glucose, Bld 118 (*)    All other components within normal limits  CBC WITH DIFFERENTIAL/PLATELET    EKG None  Radiology DG Hand Complete Left Result Date: 12/13/2023 CLINICAL DATA:  Swelling of left hand.  No known injury. EXAM: LEFT HAND - COMPLETE 3+ VIEW COMPARISON:  Left hand radiographs 12/11/2023 FINDINGS: Normal bone mineralization. Mild to moderate second and fifth and mild third DIP joint space narrowing and peripheral osteophytosis. Unchanged mild second metacarpal head hook osteophytes. 3 mm ulnar positive variance. No acute fracture or dislocation. New moderate soft tissue swelling dorsal to the metacarpals and metacarpophalangeal joints as well as the dorsal wrist. IMPRESSION: 1. New moderate soft tissue swelling dorsal to the metacarpals and metacarpophalangeal joints as well as the dorsal wrist. 2. 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 Electronically Signed   By: Neita Garnet M.D.   On: 12/13/2023 15:57    Procedures .Incision and Drainage  Date/Time: 12/13/2023 6:35 PM  Performed by: Peter Garter, PA Authorized by: Peter Garter, PA   Consent:    Consent obtained:  Verbal   Consent given by:  Patient   Risks discussed:  Bleeding, incomplete drainage, pain and damage to other organs   Alternatives discussed:  No treatment Universal protocol:    Procedure explained and questions answered to patient or proxy's satisfaction: yes     Relevant documents present and verified: yes     Test results available : yes     Imaging studies available: yes     Required blood products, implants, devices, and special equipment available: yes     Site/side marked: yes      Immediately prior to procedure, a time out was called: yes     Patient identity confirmed:  Verbally with patient Location:    Type:  Abscess   Size:  3.1   Location:  Upper extremity   Upper extremity location:  Finger   Finger location:  L index finger Pre-procedure details:    Skin preparation:  Betadine Anesthesia:    Anesthesia method:  Local infiltration   Local anesthetic:  Lidocaine 2% WITH epi Procedure type:    Complexity:  Complex Procedure details:    Incision types:  Single straight   Incision depth:  Subcutaneous  Wound management:  Probed and deloculated, irrigated with saline and extensive cleaning   Drainage:  Purulent   Drainage amount:  Moderate   Wound treatment:  Wound left open   Packing materials:  None Post-procedure details:    Procedure completion:  Tolerated well, no immediate complications     Medications Ordered in ED Medications  lidocaine-EPINEPHrine (XYLOCAINE W/EPI) 2 %-1:200000 (PF) injection 10 mL (has no administration in time range)  clindamycin (CLEOCIN) capsule 300 mg (300 mg Oral Given 12/13/23 1835)    ED Course/ Medical Decision Making/ A&P Clinical Course as of 12/13/23 1835  Tue Dec 13, 2023  1804 Consulted hand Dr. Denese Killings who recommended I&D in the ED.  Lower suspicion for septic flexor tenosynovitis.  Will begin patient's antibiotics again. [CR]    Clinical Course User Index [CR] Peter Garter, PA                                 Medical Decision Making Risk Prescription drug management.   This patient presents to the ED for concern of hand pain, this involves an extensive number of treatment options, and is a complaint that carries with it a high risk of complications and morbidity.  The differential diagnosis includes fracture, strain/pain, dislocation, ligament/since injury, neurovascular compromise   Co morbidities that complicate the patient evaluation  See HPI   Additional history obtained:  Additional  history obtained from EMR External records from outside source obtained and reviewed including hospital records   Lab Tests:  I Ordered, and personally interpreted labs.  The pertinent results include: No leukocytosis.  No evidence of anemia.  Platelets within range.  Mild hyponatremia 134 otherwise, electrolytes within limits.  No renal dysfunction.   Imaging Studies ordered:  I ordered imaging studies including x-ray left hand I independently visualized and interpreted imaging which showed moderate soft tissue swelling dorsal metacarpals and MCP joints.  Mild to moderate second and fifth mild third DIP osteoarthritis I agree with the radiologist interpretation  Cardiac Monitoring: / EKG:  The patient was maintained on a cardiac monitor.  I personally viewed and interpreted the cardiac monitored which showed an underlying rhythm of: Sinus rhythm   Consultations Obtained:  See ED course  Problem List / ED Course / Critical interventions / Medication management  Abscess, cellulitis I ordered medication including lidocaine with epinephrine, clindamycin   Reevaluation of the patient after these medicines showed that the patient improved I have reviewed the patients home medicines and have made adjustments as needed   Social Determinants of Health:  Polysubstance use   Test / Admission - Considered:  Abscess, cellulitis Vitals signs significant for hypertension blood pressure 158/93. Otherwise within normal range and stable throughout visit. Laboratory/imaging studies significant for: See above 63 year old male presents emergency department with complaints of right hand pain/swelling/redness.  Has been having symptoms for the past 5 to 6 days.  Was seen in the ER on 12/11/23 and had I&D performed and was placed on clindamycin.  Patient states that he did not take off the dressing until yesterday and did not pick up the antibiotics at the pharmacy.  Denies any fevers, chills.   States that the swelling as well as redness is worse.  Has still been noticing some pus drainage from the site where the incision was made.  Denies history of IV drug use or known trauma/injuries.  States he does not know exactly what caused the  area to become infected in the first place. On exam, area fluctuance dorsal aspect of second digit of left hand.  Extending erythema as well as swelling of the proximal hand.  No clinical evidence of septic flexor tenosynovitis.  Given the patient had I&D performed 2 days ago and presented again to the emergency department, consulted hand specialist regarding the patient who recommended I&D at bedside in the ED with thorough washout and recommend compliance with antibiotics.  Regarding antibiotics, patient lives at a living facility and did not have the means to physically get to the pharmacy.  He was alerted by the facility that his antibiotics were picked up earlier today but has not taken any dosages in the outpatient setting.  Would recommend local wound care, daily bandage changes and compliance with antibiotics.  Treatment plan discussed at length with patient and he acknowledged understanding was agreeable to said plan.  Patient overall well-appearing, afebrile in no acute distress. Worrisome signs and symptoms were discussed with the patient, and the patient acknowledged understanding to return to the ED if noticed. Patient was stable upon discharge.          Final Clinical Impression(s) / ED Diagnoses Final diagnoses:  Abscess    Rx / DC Orders ED Discharge Orders     None         Peter Garter, [place] 12/13/23 1836    Charlynne Pander, MD 12/13/23 2214

## 2023-12-13 NOTE — ED Provider Notes (Addendum)
[facility]    CSN: [id number] Arrival date & time: 12/13/23  1031      History   Chief Complaint Chief Complaint  Patient presents with   Abscess    HPI Ivan Murphy is a 63 y.o. male comes back due to his L hand now being more swollen and red, and has spread to the rest of his hand. He went to ER on 3/6 and was given Clinda shot and Clinda PO, but he never started the PO antibiotic since the place where he stays did not pay for it, and decided to order it internally, and still has not received it.      Past Medical History:  Diagnosis Date   Abnormal EKG, felt to be due to hypertension  02/28/2013   Chest pain at rest 02/26/2013   Headache(784.0)    Hypertension    S/P cardiac cath, 02/28/13 02/28/2013    Patient Active Problem List   Diagnosis Date Noted   Cocaine dependence with cocaine-induced mood disorder [facility])    MDD (major depressive disorder) 05/20/2019   Fever 04/18/2013   Dizziness 04/17/2013   Acute streptococcal pharyngitis 02/28/2013   S/P cardiac cath, 02/28/13, patent coronary arteries 02/28/2013   Abnormal EKG, felt to be due to hypertension  02/28/2013   Chest pain, non cardiac, may be due to hypertension 02/27/2013   Headache 02/27/2013   Cough 02/27/2013   Tobacco abuse 02/27/2013   Hx of cocaine abuse (HCC) 02/27/2013    Past Surgical History:  Procedure Laterality Date   CARDIAC CATHETERIZATION  02/28/2013   IRRIGATION AND DEBRIDEMENT ABSCESS     LEFT HEART CATHETERIZATION WITH CORONARY ANGIOGRAM N/A 02/28/2013   Procedure: LEFT HEART CATHETERIZATION WITH CORONARY ANGIOGRAM;  Surgeon: Runell Gess, MD;  Location: [facility] CATH LAB;  Service: Cardiovascular;  Laterality: N/A;       Home Medications    Prior to Admission medications   Medication Sig Start Date End Date Taking? Authorizing Provider  acetaminophen (TYLENOL) 325 MG tablet Take 650 mg by mouth every 6 (six) hours as needed for headache.    [provider]   clindamycin (CLEOCIN) 150 MG capsule Take 2 capsules (300 mg total) by mouth 4 (four) times daily. 12/12/23   Gilda Crease, MD  cyclobenzaprine (FLEXERIL) 10 MG tablet Take 1 tablet (10 mg total) by mouth 3 (three) times daily as needed. 05/09/20   Triplett, Tammy, PA-C  etodolac (LODINE) 400 MG tablet Take 1 tablet (400 mg total) by mouth 2 (two) times daily. 10/02/23   Linwood Dibbles, MD  HYDROcodone-acetaminophen (NORCO/VICODIN) 5-325 MG tablet Take 1 tablet by mouth every 6 (six) hours as needed for severe pain (pain score 7-10). 10/02/23   Linwood Dibbles, MD  methocarbamol (ROBAXIN) 500 MG tablet Take 1 tablet (500 mg total) by mouth 2 (two) times daily. 10/02/23   Linwood Dibbles, MD  oxyCODONE-acetaminophen (PERCOCET/ROXICET) 5-325 MG tablet Take 1 tablet by mouth every 6 (six) hours as needed for severe pain. 05/14/20   Cristina Gong, PA-C  oxyCODONE-acetaminophen (PERCOCET/ROXICET) 5-325 MG tablet Take 1 tablet by mouth every 6 (six) hours as needed for severe pain. 05/14/20   Cristina Gong, PA-C    Family History Family History  Problem Relation Age of Onset   Diabetes Brother     Social History Social History   Tobacco Use   Smoking status: Every Day    Current packs/day: 0.50    Average packs/day: 0.5 packs/day for 39.0 years (  19.5 ttl pk-yrs)    Types: Cigarettes   Smokeless tobacco: Never  Vaping Use   Vaping status: Never Used  Substance Use Topics   Alcohol use: Yes   Drug use: Not Currently    Types: Cocaine    Comment: last used 05/19/2019     Allergies   Patient has no known allergies.   Review of Systems Review of Systems As noted in HPI  Physical Exam Triage Vital Signs ED Triage Vitals  Encounter Vitals Group     BP 12/13/23 1154 (!) 137/93     Systolic BP Percentile --      Diastolic BP Percentile --      Pulse Rate 12/13/23 1154 81     Resp 12/13/23 1154 18     Temp 12/13/23 1154 97.9 F (36.6 C)     Temp Source 12/13/23 1154 Oral      SpO2 12/13/23 1154 95 %     Weight --      Height --      Head Circumference --      Peak Flow --      Pain Score 12/13/23 1150 8     Pain Loc --      Pain Education --      Exclude from Growth Chart --    No data found.  Updated Vital Signs BP (!) 137/93 (BP Location: Right Arm)   Pulse 81   Temp 97.9 F (36.6 C) (Oral)   Resp 18   SpO2 95%   Visual Acuity Right Eye Distance:   Left Eye Distance:   Bilateral Distance:    Right Eye Near:   Left Eye Near:    Bilateral Near:     Physical Exam Skin:    Comments: L HAND- with moderate swelling, warmth of entire hand, but not wrist. Has open wound on proximal index finger where they attempted I&D.       UC Treatments / Results  Labs (all labs ordered are listed, but only abnormal results are displayed) Labs Reviewed - No data to display  EKG   Radiology DG Hand Complete Left Result Date: 12/11/2023 CLINICAL DATA:  Left hand swelling predominantly around the index and middle finger. No injury. EXAM: LEFT HAND - COMPLETE 3+ VIEW COMPARISON:  None Available. FINDINGS: No fracture or bone lesion. Joints are normally spaced and aligned. Index finger soft tissue swelling. Also soft tissue swelling across the dorsum of the hand. No soft tissue air. IMPRESSION: 1. No fracture or acute bone abnormality. 2. Soft tissue swelling. No soft tissue air. Electronically Signed   By: Amie Portland M.D.   On: 12/11/2023 17:07    Procedures Procedures (including critical care time)  Medications Ordered in UC Medications - No data to display  Initial Impression / Assessment and Plan / UC Course  I have reviewed the triage vital signs and the nursing notes.  Severe L hand cellulitis  Sent to ER Final Clinical Impressions(s) / UC Diagnoses   Final diagnoses:  Abscess around fingernail of left hand  Cellulitis of finger of left hand     Discharge Instructions      Please go to the ER right now, since your hand is worse and  you have not started the antibiotic last week. You will need IV antibiotics     ED Prescriptions   None    PDMP not reviewed this encounter.   Garey Ham, PA-C 12/13/23 1234    Rodriguez-Southworth, Nettie Elm, PA-C 12/13/23  1234  

## 2023-12-13 NOTE — Discharge Instructions (Signed)
 Please go to the ER right now, since your hand is worse and you have not started the antibiotic last week. You will need IV antibiotics

## 2023-12-13 NOTE — ED Provider Triage Note (Signed)
 Emergency Medicine Provider Triage Evaluation Note  Ivan Murphy , a 63 y.o. male  was evaluated in triage.  Pt complains of L hand swelling. Seen for abscess on L second finger on 16th, not on clindamycin as prescribed d/t med not coming in yet  Review of Systems  Positive: Pain, swelling, erythema Negative: Fever, chills  Physical Exam  BP (!) 143/91 (BP Location: Right Arm)   Pulse 73   Temp 98.4 F (36.9 C)   Resp 18   Ht 5\' 7"  (1.702 m)   Wt 90.7 kg   SpO2 96%   BMI 31.32 kg/m  Gen:   Awake, no distress   Resp:  Normal effort  MSK:   Moves extremities without difficulty  Other:  Reduced ROM d/t swelling, +2 pulses  Medical Decision Making  Medically screening exam initiated at 1:58 PM.  Appropriate orders placed.  Ivan Murphy was informed that the remainder of the evaluation will be completed by another provider, this initial triage assessment does not replace that evaluation, and the importance of remaining in the ED until their evaluation is complete.  Labs and imaging ordered   Ivan Jenny, PA-C 12/13/23 1401

## 2023-12-13 NOTE — ED Triage Notes (Signed)
 Pt states he was seen at [facility] and sent to Ed last week for left pointer finger abscess. He states the finger is worse now than last week. He left the bandage on the finger until today. He states swelling is now spread to his whole hand.   He states he hasn't picked up his antibiotics yet he planned to pick them up this evening. Pt states he is in [employer] and he isn't sure about pharmacy since they manage all meds and paying for them.   He also needs a note for work, he works at the [employer].

## 2023-12-13 NOTE — Discharge Instructions (Signed)
 As discussed, keep bandage on your hand for the next 24 hours.  Recommend daily bandage change after that and washing area with warm soapy water.  Take antibiotic as directed.  Attaches information for hand specialist to follow-up with.  He is expecting you on Monday but please call them tomorrow to schedule an appointment.  Please do not hesitate to return if the worrisome signs and symptoms we discussed become apparent.

## 2023-12-13 NOTE — ED Notes (Signed)
 Patient is being discharged from the Urgent Care and sent to the Emergency Department via POV . Per Garey Ham, PA-C, patient is in need of higher level of care due to cellulitis. Patient is aware and verbalizes understanding of plan of care.  Vitals:   12/13/23 1154  BP: (!) 137/93  Pulse: 81  Resp: 18  Temp: 97.9 F (36.6 C)  SpO2: 95%

## 2023-12-13 NOTE — ED Triage Notes (Signed)
 Pt states he had an I&D of a abscess on left second finger on Sunday and was waiting for the drug store to receive the atx. Pt came her for left hand swelling. Pt has 3+ swelling of left hand and fingers. Pt has purulent drainage from left second finger. Pt unable to make a fist due to swelling.

## 2024-01-11 ENCOUNTER — Encounter (HOSPITAL_COMMUNITY): Payer: Self-pay

## 2024-01-11 ENCOUNTER — Ambulatory Visit (HOSPITAL_COMMUNITY)
Admission: EM | Admit: 2024-01-11 | Discharge: 2024-01-11 | Disposition: A | Discharge status: Home / Self Care | Payer: Self-pay | Attending: Emergency Medicine | Admitting: Emergency Medicine

## 2024-01-11 DIAGNOSIS — L02411 Cutaneous abscess of right axilla: Secondary | ICD-10-CM

## 2024-01-11 DIAGNOSIS — L02811 Cutaneous abscess of head [any part, except face]: Secondary | ICD-10-CM

## 2024-01-11 MED ORDER — CLINDAMYCIN HCL 300 MG PO CAPS: 300.0000 mg | ORAL_CAPSULE | Freq: Four times a day (QID) | ORAL | 0 refills | Status: AC

## 2024-01-11 NOTE — ED Provider Notes (Signed)
 [facility]    CSN: [id number] Arrival date & time: 01/11/24  4098      History   Chief Complaint Chief Complaint  Patient presents with   Abscess    HPI Ivan Murphy is a 63 y.o. male.   Patient presents with a bump to the back of his head that he noticed a few days ago. Upon waking this morning he also noticed 2 bumps in his right armpit. Patient states that the bumps are itchy and painful. Denies drainage from the areas. Denies fever.   Patient was recently treated with Clindamycin for an abscess and cellulitis to the hand that resolved.  The history is provided by the patient and medical records.  Abscess   Past Medical History:  Diagnosis Date   Abnormal EKG, felt to be due to hypertension  02/28/2013   Chest pain at rest 02/26/2013   Headache(784.0)    Hypertension    S/P cardiac cath, 02/28/13 02/28/2013    Patient Active Problem List   Diagnosis Date Noted   Cocaine dependence with cocaine-induced mood disorder [facility])    MDD (major depressive disorder) 05/20/2019   Fever 04/18/2013   Dizziness 04/17/2013   Acute streptococcal pharyngitis 02/28/2013   S/P cardiac cath, 02/28/13, patent coronary arteries 02/28/2013   Abnormal EKG, felt to be due to hypertension  02/28/2013   Chest pain, non cardiac, may be due to hypertension 02/27/2013   Headache 02/27/2013   Cough 02/27/2013   Tobacco abuse 02/27/2013   Hx of cocaine abuse (HCC) 02/27/2013    Past Surgical History:  Procedure Laterality Date   CARDIAC CATHETERIZATION  02/28/2013   IRRIGATION AND DEBRIDEMENT ABSCESS     LEFT HEART CATHETERIZATION WITH CORONARY ANGIOGRAM N/A 02/28/2013   Procedure: LEFT HEART CATHETERIZATION WITH CORONARY ANGIOGRAM;  Surgeon: Runell Gess, MD;  Location: [facility] CATH LAB;  Service: Cardiovascular;  Laterality: N/A;       Home Medications    Prior to Admission medications   Medication Sig Start Date End Date Taking? Authorizing Provider  clindamycin (CLEOCIN) 300  MG capsule Take 1 capsule (300 mg total) by mouth 4 (four) times daily for 7 days. 01/11/24 01/18/24 Yes Wynonia Lawman A, NP  acetaminophen (TYLENOL) 325 MG tablet Take 650 mg by mouth every 6 (six) hours as needed for headache.    [provider]  cyclobenzaprine (FLEXERIL) 10 MG tablet Take 1 tablet (10 mg total) by mouth 3 (three) times daily as needed. 05/09/20   Triplett, Tammy, PA-C  etodolac (LODINE) 400 MG tablet Take 1 tablet (400 mg total) by mouth 2 (two) times daily. 10/02/23   Linwood Dibbles, MD  HYDROcodone-acetaminophen (NORCO/VICODIN) 5-325 MG tablet Take 1 tablet by mouth every 6 (six) hours as needed for severe pain (pain score 7-10). 10/02/23   Linwood Dibbles, MD  methocarbamol (ROBAXIN) 500 MG tablet Take 1 tablet (500 mg total) by mouth 2 (two) times daily. 10/02/23   Linwood Dibbles, MD  oxyCODONE-acetaminophen (PERCOCET/ROXICET) 5-325 MG tablet Take 1 tablet by mouth every 6 (six) hours as needed for severe pain. 05/14/20   Cristina Gong, PA-C  oxyCODONE-acetaminophen (PERCOCET/ROXICET) 5-325 MG tablet Take 1 tablet by mouth every 6 (six) hours as needed for severe pain. 05/14/20   Cristina Gong, PA-C    Family History Family History  Problem Relation Age of Onset   Diabetes Brother     Social History Social History   Tobacco Use   Smoking status: Every Day  Current packs/day: 0.50    Average packs/day: 0.5 packs/day for 39.0 years (19.5 ttl pk-yrs)    Types: Cigarettes   Smokeless tobacco: Never  Vaping Use   Vaping status: Never Used  Substance Use Topics   Alcohol use: Not Currently   Drug use: Not Currently    Types: Cocaine    Comment: last used 05/19/2019     Allergies   Patient has no known allergies.   Review of Systems Review of Systems  Per HPI  Physical Exam Triage Vital Signs ED Triage Vitals  Encounter Vitals Group     BP 01/11/24 0903 (!) 149/81     Systolic BP Percentile --      Diastolic BP Percentile --      Pulse Rate  01/11/24 0903 64     Resp 01/11/24 0903 18     Temp 01/11/24 0903 97.6 F (36.4 C)     Temp Source 01/11/24 0903 Oral     SpO2 01/11/24 0903 95 %     Weight --      Height --      Head Circumference --      Peak Flow --      Pain Score 01/11/24 0904 7     Pain Loc --      Pain Education --      Exclude from Growth Chart --    No data found.  Updated Vital Signs BP (!) 149/81 (BP Location: Left Arm)   Pulse 64   Temp 97.6 F (36.4 C) (Oral)   Resp 18   SpO2 95%   Visual Acuity Right Eye Distance:   Left Eye Distance:   Bilateral Distance:    Right Eye Near:   Left Eye Near:    Bilateral Near:     Physical Exam Vitals and nursing note reviewed.  Constitutional:      General: He is awake. He is not in acute distress.    Appearance: Normal appearance. He is well-developed and well-groomed. He is not ill-appearing.  Skin:    General: Skin is warm and dry.     Findings: Abscess present.       Neurological:     Mental Status: He is alert.  Psychiatric:        Behavior: Behavior is cooperative.      UC Treatments / Results  Labs (all labs ordered are listed, but only abnormal results are displayed) Labs Reviewed - No data to display  EKG   Radiology No results found.  Procedures Procedures (including critical care time)  Medications Ordered in UC Medications - No data to display  Initial Impression / Assessment and Plan / UC Course  I have reviewed the triage vital signs and the nursing notes.  Pertinent labs & imaging results that were available during my care of the patient were reviewed by me and considered in my medical decision making (see chart for details).     Deferred I&D at this time due to areas being indurated at this time. Prescribed Clindamycin for abscess coverage. Discussed use of warm compresses. Discussed return precautions.  Final Clinical Impressions(s) / UC Diagnoses   Final diagnoses:  Abscess of axilla, right  Abscess of  head     Discharge Instructions      Start taking Clindamycin 4 times daily for 7 days for abscess coverage.   Apply warm compresses (warm, wet wash cloth) to the area on the back of your head and the areas in your  right armpit to help promote drainage from the areas.   Return here if the areas become larger, you noticed spreading redness, or a lot of drainage from the areas.    ED Prescriptions     Medication Sig Dispense Auth. Provider   clindamycin (CLEOCIN) 300 MG capsule Take 1 capsule (300 mg total) by mouth 4 (four) times daily for 7 days. 28 capsule Levora Reas A, NP      PDMP not reviewed this encounter.   Levora Reas A, NP 01/11/24 [phone]

## 2024-01-11 NOTE — Discharge Instructions (Signed)
 Start taking Clindamycin 4 times daily for 7 days for abscess coverage.   Apply warm compresses (warm, wet wash cloth) to the area on the back of your head and the areas in your right armpit to help promote drainage from the areas.   Return here if the areas become larger, you noticed spreading redness, or a lot of drainage from the areas.

## 2024-01-11 NOTE — ED Triage Notes (Signed)
 Patient states he has a "bump" on the back of his head and a few under his right arm. States they are painful and itchy.

## 2025-01-23 ENCOUNTER — Ambulatory Visit: Payer: Self-pay
